# Patient Record
Sex: Female | Born: 1935 | Race: White | Hispanic: No | Marital: Single | Smoking: Never smoker
Health system: Southern US, Community
[De-identification: ages and names within clinical notes are randomized; demographics above are authoritative.]

## PROBLEM LIST (undated history)

## (undated) DIAGNOSIS — I10 Essential (primary) hypertension: Secondary | ICD-10-CM

## (undated) DIAGNOSIS — R112 Nausea with vomiting, unspecified: Secondary | ICD-10-CM

## (undated) DIAGNOSIS — E119 Type 2 diabetes mellitus without complications: Secondary | ICD-10-CM

## (undated) DIAGNOSIS — E7800 Pure hypercholesterolemia, unspecified: Secondary | ICD-10-CM

## (undated) DIAGNOSIS — Z9889 Other specified postprocedural states: Secondary | ICD-10-CM

## (undated) DIAGNOSIS — K219 Gastro-esophageal reflux disease without esophagitis: Secondary | ICD-10-CM

## (undated) HISTORY — PX: THYROIDECTOMY, PARTIAL: SHX18

## (undated) HISTORY — PX: TONSILLECTOMY: SUR1361

## (undated) HISTORY — PX: ABDOMINAL HYSTERECTOMY: SHX81

## (undated) HISTORY — PX: CORONARY STENT PLACEMENT: SHX1402

---

## 2013-01-27 ENCOUNTER — Emergency Department: Payer: Self-pay | Admitting: Emergency Medicine

## 2013-01-27 LAB — CBC
HGB: 15.2 g/dL (ref 12.0–16.0)
MCH: 29.9 pg (ref 26.0–34.0)
Platelet: 176 10*3/uL (ref 150–440)
RBC: 5.07 10*6/uL (ref 3.80–5.20)
RDW: 13.8 % (ref 11.5–14.5)

## 2013-01-27 LAB — BASIC METABOLIC PANEL
Anion Gap: 4 — ABNORMAL LOW (ref 7–16)
Calcium, Total: 10.2 mg/dL — ABNORMAL HIGH (ref 8.5–10.1)
EGFR (African American): >60
EGFR (Non-African Amer.): >60
Osmolality: 288 (ref 275–301)
Sodium: 138 mmol/L (ref 136–145)

## 2013-08-04 ENCOUNTER — Emergency Department: Payer: Self-pay | Admitting: Emergency Medicine

## 2013-08-04 IMAGING — CR LEFT RING FINGER 2+V
1 series · 1 of 1 positions shown · non-contrast
Comparison: None.

CLINICAL DATA: Fall with left finger pain.

EXAM:
LEFT RING FINGER 2+V

[pa]
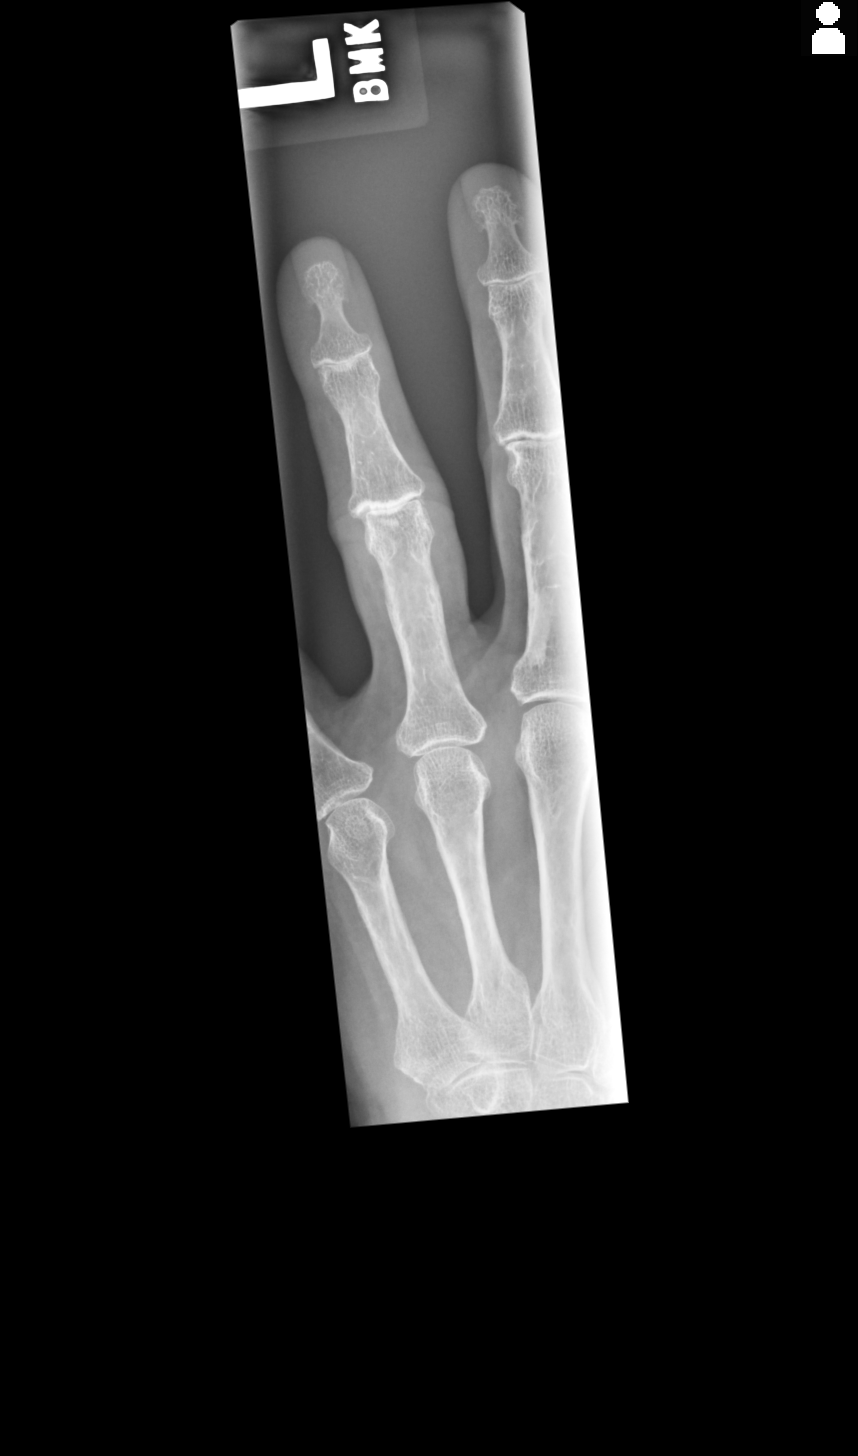

[1 of 1 positions shown; findings below may reference images not displayed]

FINDINGS: There is a tiny osseous fragment along the ventral base of the
middle phalanx. No additional evidence an acute fracture.
IMPRESSION: Tiny osseous fragment along the ventral base of the middle phalanx
is suspicious for a nondisplaced fracture.

## 2013-08-04 IMAGING — CR LEFT RING FINGER 2+V
1 series · 2 of 2 positions shown · non-contrast
Comparison: None.

CLINICAL DATA: Fall with left finger pain.

EXAM:
LEFT RING FINGER 2+V

[Series 1: oblique · 0.17mm/px · 2 of 2 slices shown]
[im 1/2]
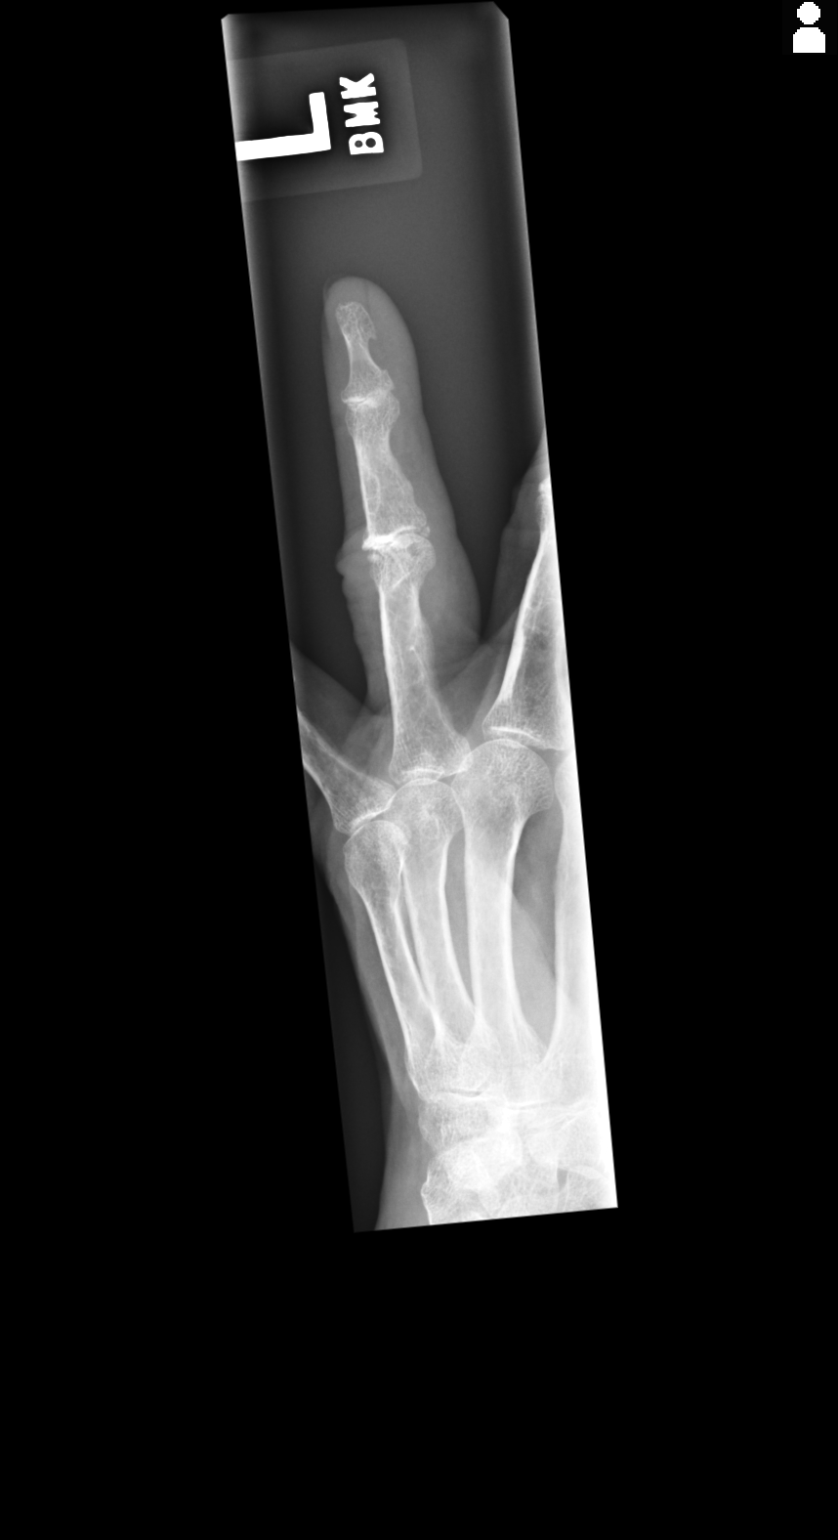
[im 2/2]
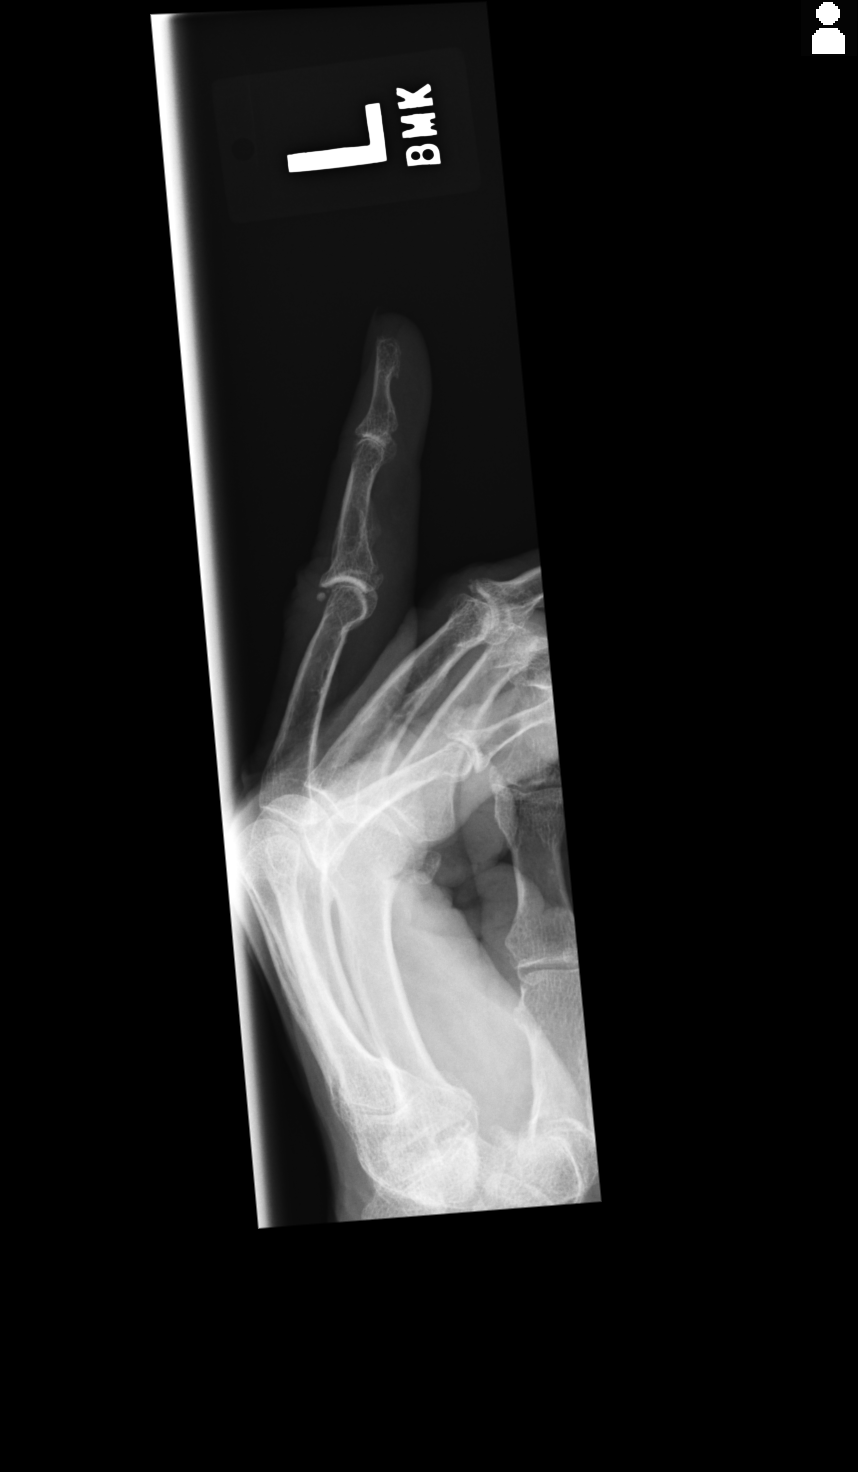

[2 of 2 positions shown; findings below may reference images not displayed]

FINDINGS: There is a tiny osseous fragment along the ventral base of the
middle phalanx. No additional evidence an acute fracture.
IMPRESSION: Tiny osseous fragment along the ventral base of the middle phalanx
is suspicious for a nondisplaced fracture.

## 2013-08-04 IMAGING — CT CT HEAD WITHOUT CONTRAST
1 series · 16 of 30 positions shown, 20 images · non-contrast
Comparison: No priors.

CLINICAL DATA: History of trauma from a fall with injury to the
forehead. Dizziness.

EXAM:
CT HEAD WITHOUT CONTRAST
TECHNIQUE: Contiguous axial images were obtained from the base of the skull
through the vertex without intravenous contrast.

[Series 3: head wo · axial · 0.43mm/px · z∈[+1209,+1338]mm · 16 of 32 slices shown, 20 images]
[im 2/32  brain]
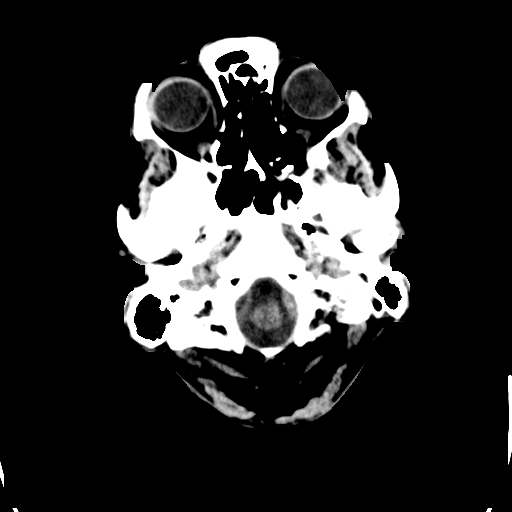
[im 2/32  bone]
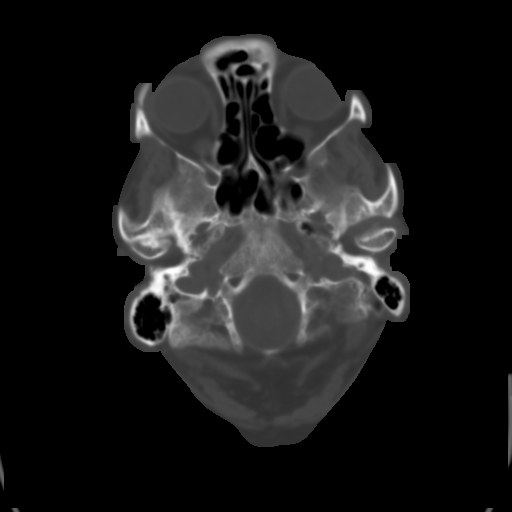
[im 4/32  brain]
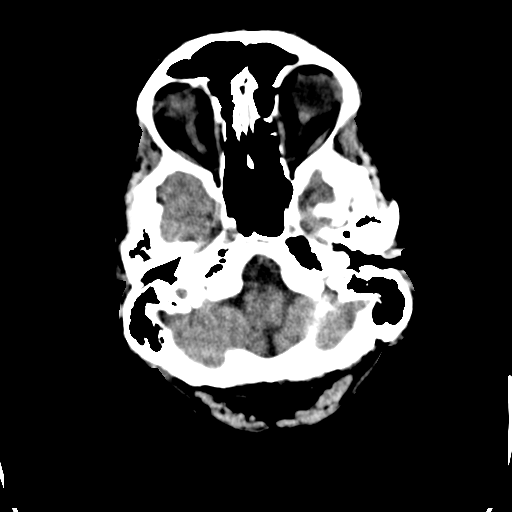
[im 6/32  brain]
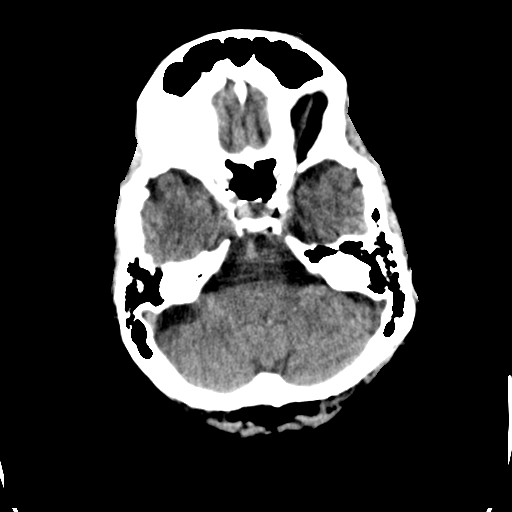
[im 8/32  brain]
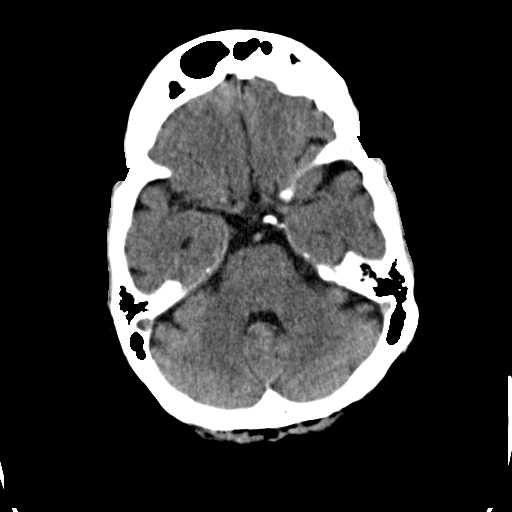
[im 9/32  brain]
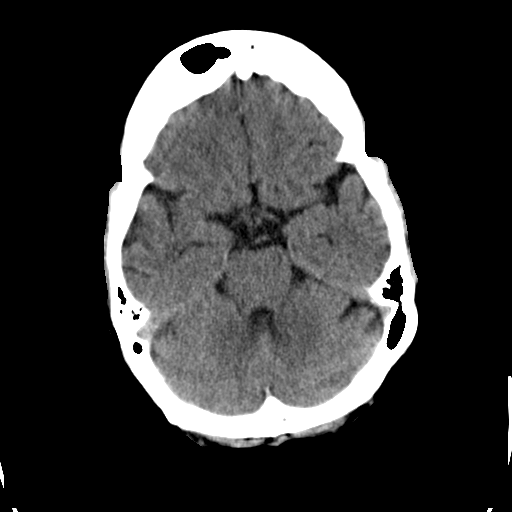
[im 9/32  bone]
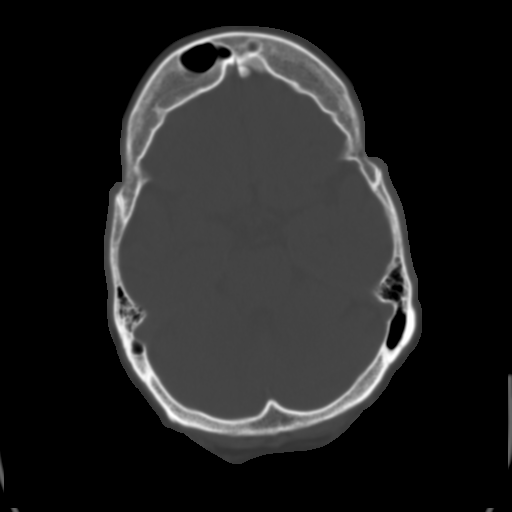
[im 11/32  brain]
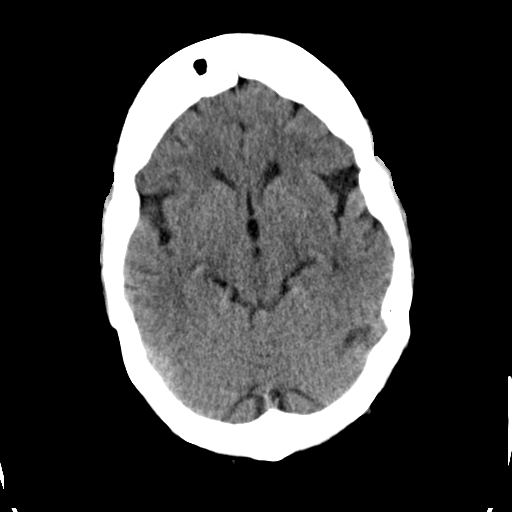
[im 13/32  brain]
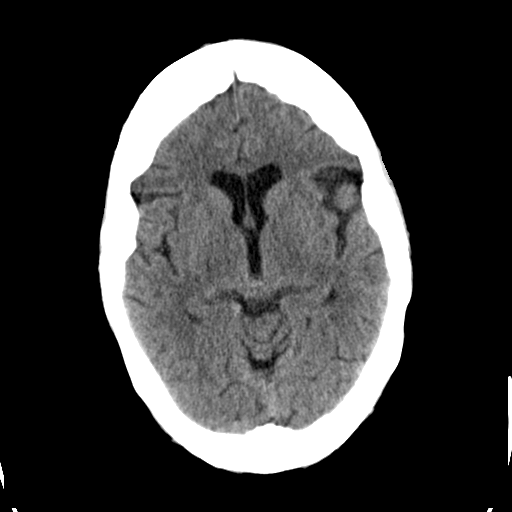
[im 15/32  brain]
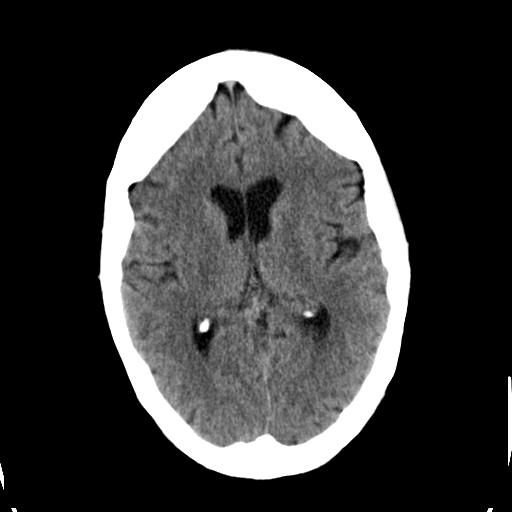
[im 17/32  brain]
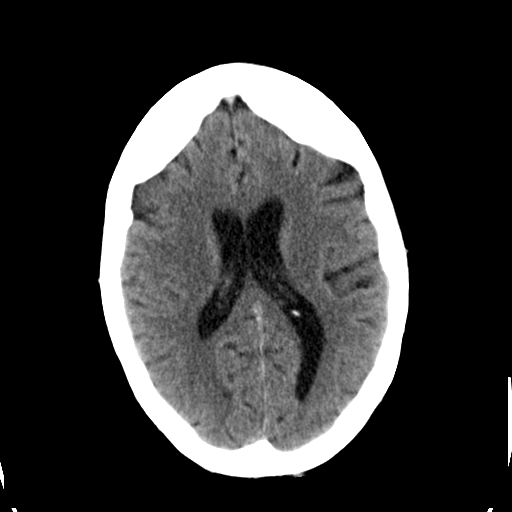
[im 17/32  bone]
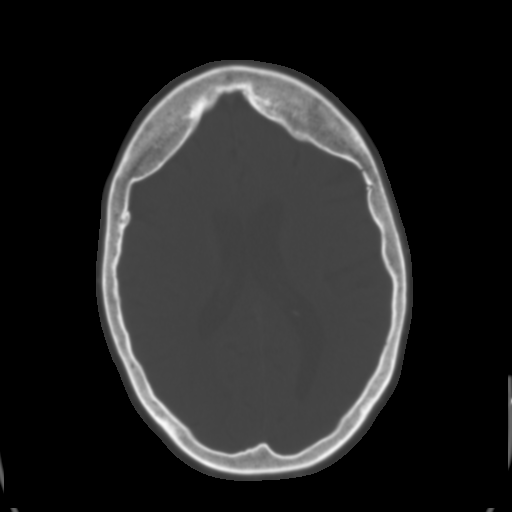
[im 19/32  brain]
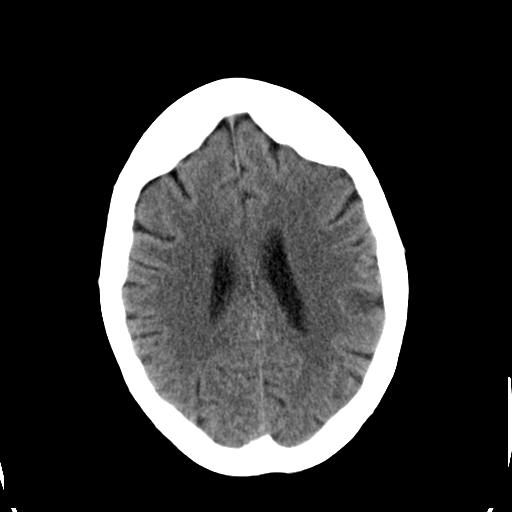
[im 21/32  brain]
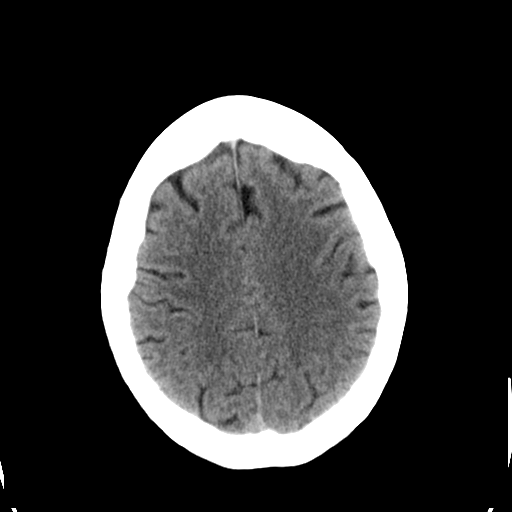
[im 23/32  brain]
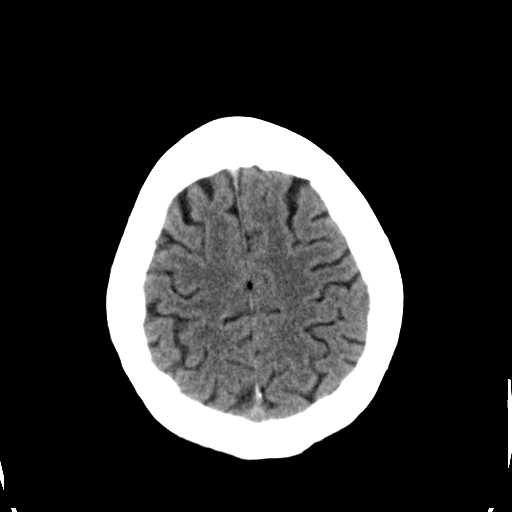
[im 24/32  brain]
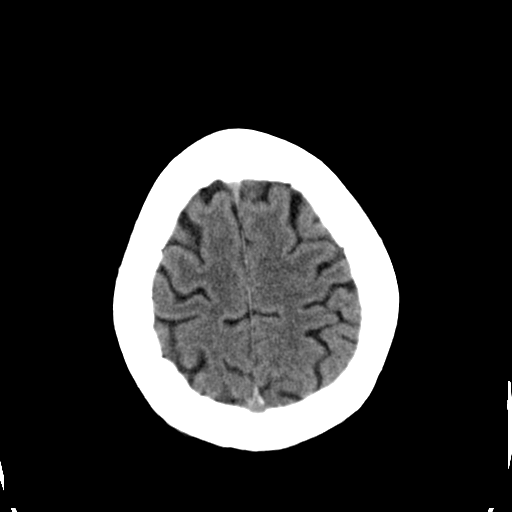
[im 24/32  bone]
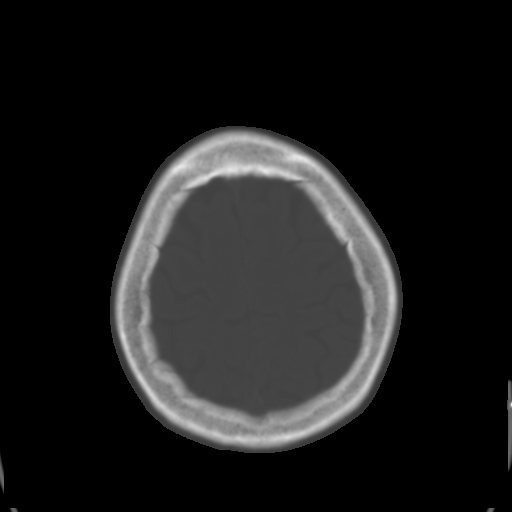
[im 26/32  brain]
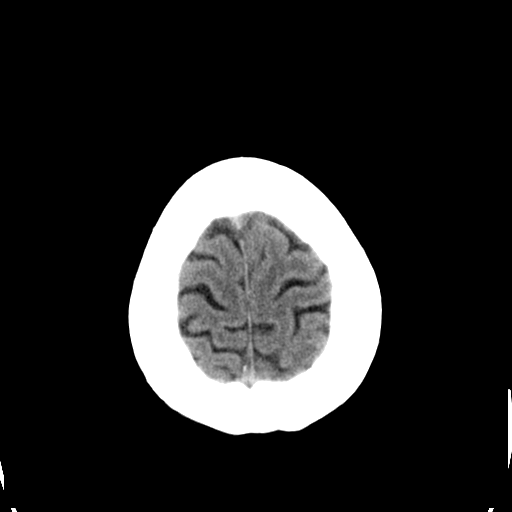
[im 28/32  brain]
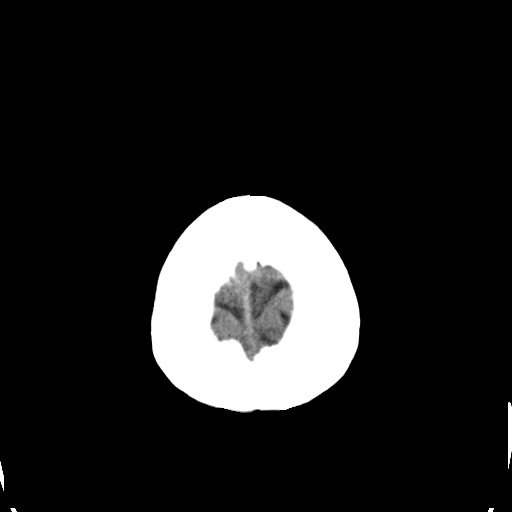
[im 30/32  brain]
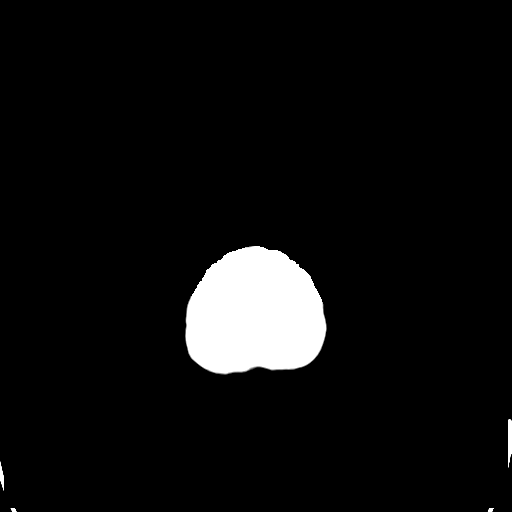

[16 of 30 positions shown; findings below may reference images not displayed]

FINDINGS: No acute displaced skull fractures are identified. No acute
intracranial abnormality. Specifically, no evidence of acute
post-traumatic intracranial hemorrhage, no definite regions of
acute/subacute cerebral ischemia, no focal mass, mass effect,
hydrocephalus or abnormal intra or extra-axial fluid collections.
The visualized paranasal sinuses and mastoids are well pneumatized.
IMPRESSION: 1. No acute displaced skull fractures or acute intracranial
abnormalities.
2. The appearance of the brain is normal.

## 2015-06-09 ENCOUNTER — Other Ambulatory Visit: Payer: Self-pay

## 2015-06-09 ENCOUNTER — Emergency Department: Payer: Medicare Other

## 2015-06-09 ENCOUNTER — Encounter: Payer: Self-pay | Admitting: *Deleted

## 2015-06-09 ENCOUNTER — Inpatient Hospital Stay
Admission: EM | Admit: 2015-06-09 | Discharge: 2015-06-11 | DRG: 378 | Disposition: A | Discharge status: Home / Self Care | Payer: Medicare Other | Attending: Internal Medicine | Admitting: Internal Medicine

## 2015-06-09 DIAGNOSIS — K921 Melena: Principal | ICD-10-CM | POA: Diagnosis present

## 2015-06-09 DIAGNOSIS — Z7982 Long term (current) use of aspirin: Secondary | ICD-10-CM

## 2015-06-09 DIAGNOSIS — K449 Diaphragmatic hernia without obstruction or gangrene: Secondary | ICD-10-CM | POA: Diagnosis present

## 2015-06-09 DIAGNOSIS — K2900 Acute gastritis without bleeding: Secondary | ICD-10-CM | POA: Diagnosis present

## 2015-06-09 DIAGNOSIS — E119 Type 2 diabetes mellitus without complications: Secondary | ICD-10-CM | POA: Diagnosis present

## 2015-06-09 DIAGNOSIS — R002 Palpitations: Secondary | ICD-10-CM

## 2015-06-09 DIAGNOSIS — I10 Essential (primary) hypertension: Secondary | ICD-10-CM | POA: Diagnosis present

## 2015-06-09 DIAGNOSIS — E785 Hyperlipidemia, unspecified: Secondary | ICD-10-CM | POA: Diagnosis present

## 2015-06-09 DIAGNOSIS — D62 Acute posthemorrhagic anemia: Secondary | ICD-10-CM | POA: Diagnosis present

## 2015-06-09 DIAGNOSIS — Z8249 Family history of ischemic heart disease and other diseases of the circulatory system: Secondary | ICD-10-CM | POA: Diagnosis not present

## 2015-06-09 DIAGNOSIS — K922 Gastrointestinal hemorrhage, unspecified: Secondary | ICD-10-CM | POA: Diagnosis present

## 2015-06-09 DIAGNOSIS — E7800 Pure hypercholesterolemia, unspecified: Secondary | ICD-10-CM | POA: Diagnosis present

## 2015-06-09 DIAGNOSIS — T39395A Adverse effect of other nonsteroidal anti-inflammatory drugs [NSAID], initial encounter: Secondary | ICD-10-CM | POA: Diagnosis present

## 2015-06-09 DIAGNOSIS — D649 Anemia, unspecified: Secondary | ICD-10-CM

## 2015-06-09 DIAGNOSIS — Z7984 Long term (current) use of oral hypoglycemic drugs: Secondary | ICD-10-CM

## 2015-06-09 DIAGNOSIS — K319 Disease of stomach and duodenum, unspecified: Secondary | ICD-10-CM | POA: Diagnosis present

## 2015-06-09 DIAGNOSIS — Z79899 Other long term (current) drug therapy: Secondary | ICD-10-CM

## 2015-06-09 HISTORY — DX: Type 2 diabetes mellitus without complications: E11.9

## 2015-06-09 HISTORY — DX: Pure hypercholesterolemia, unspecified: E78.00

## 2015-06-09 HISTORY — DX: Essential (primary) hypertension: I10

## 2015-06-09 LAB — CBC
HEMATOCRIT: 22.4 % — AB (ref 35.0–47.0)
HEMOGLOBIN: 7.5 g/dL — AB (ref 12.0–16.0)
MCH: 29.3 pg (ref 26.0–34.0)
MCHC: 33.3 g/dL (ref 32.0–36.0)
MCV: 87.9 fL (ref 80.0–100.0)
Platelets: 232 10*3/uL (ref 150–440)
RBC: 2.55 MIL/uL — ABNORMAL LOW (ref 3.80–5.20)
RDW: 15.5 % — ABNORMAL HIGH (ref 11.5–14.5)
WBC: 6.9 10*3/uL (ref 3.6–11.0)

## 2015-06-09 LAB — ABO/RH: ABO/RH(D): O NEG

## 2015-06-09 LAB — PREPARE RBC (CROSSMATCH)

## 2015-06-09 LAB — BASIC METABOLIC PANEL
Anion gap: 7 (ref 5–15)
BUN: 17 mg/dL (ref 6–20)
CO2: 25 mmol/L (ref 22–32)
Calcium: 10.1 mg/dL (ref 8.9–10.3)
Chloride: 104 mmol/L (ref 101–111)
Creatinine, Ser: 0.65 mg/dL (ref 0.44–1.00)
GFR calc Af Amer: >60 mL/min (ref >60)
Glucose, Bld: 146 mg/dL — ABNORMAL HIGH (ref 65–99)
POTASSIUM: 3.8 mmol/L (ref 3.5–5.1)
SODIUM: 136 mmol/L (ref 135–145)

## 2015-06-09 LAB — TROPONIN I: Troponin I: <0.03 ng/mL (ref <0.031)

## 2015-06-09 LAB — GLUCOSE, CAPILLARY: Glucose-Capillary: 144 mg/dL — ABNORMAL HIGH (ref 65–99)

## 2015-06-09 IMAGING — CR DG CHEST 2V
1 series · 2 of 2 positions shown · non-contrast
Comparison: None.

CLINICAL DATA: Tachycardia and shortness of breath

EXAM:
CHEST  2 VIEW

[Series 1: dg chest 2 view · 0.14mm/px · 2 of 2 slices shown]
[im 1/2]
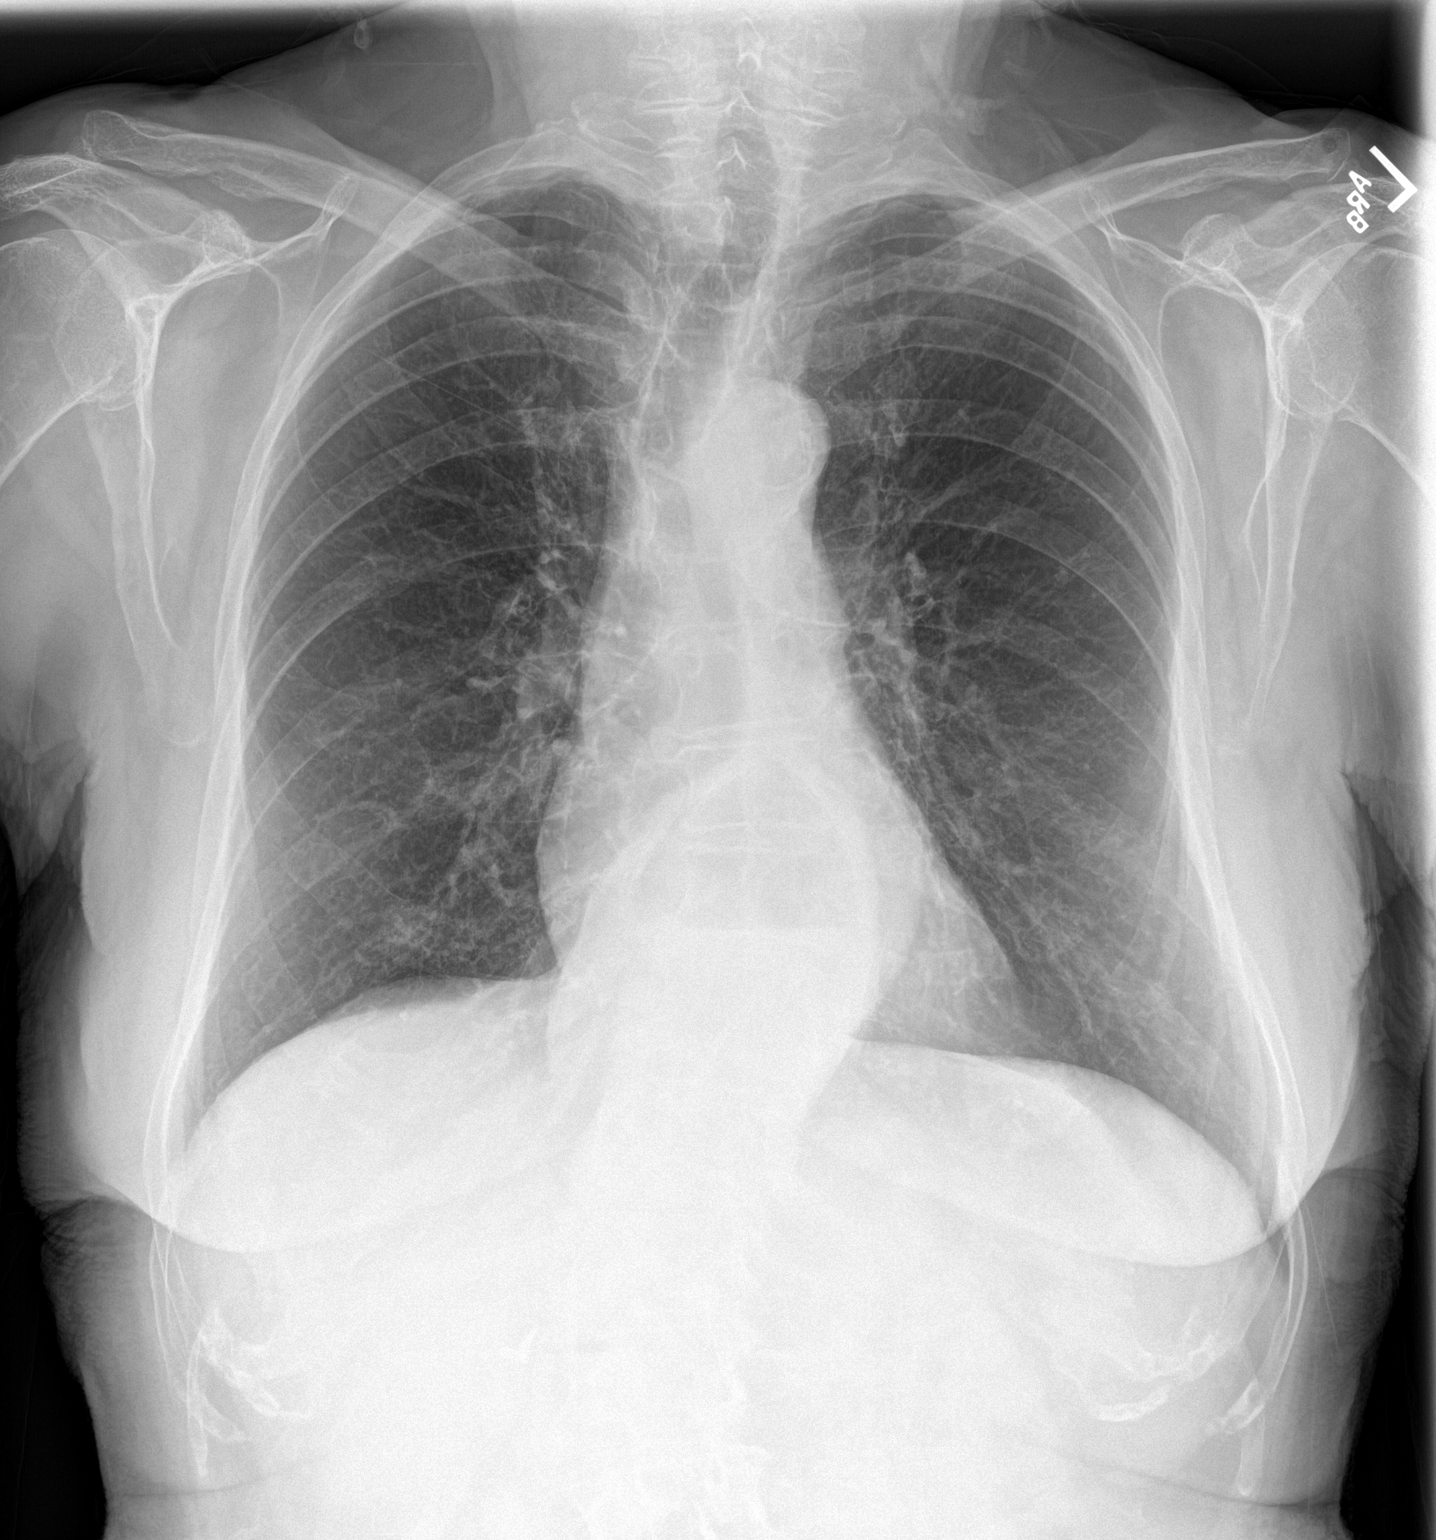
[im 2/2]
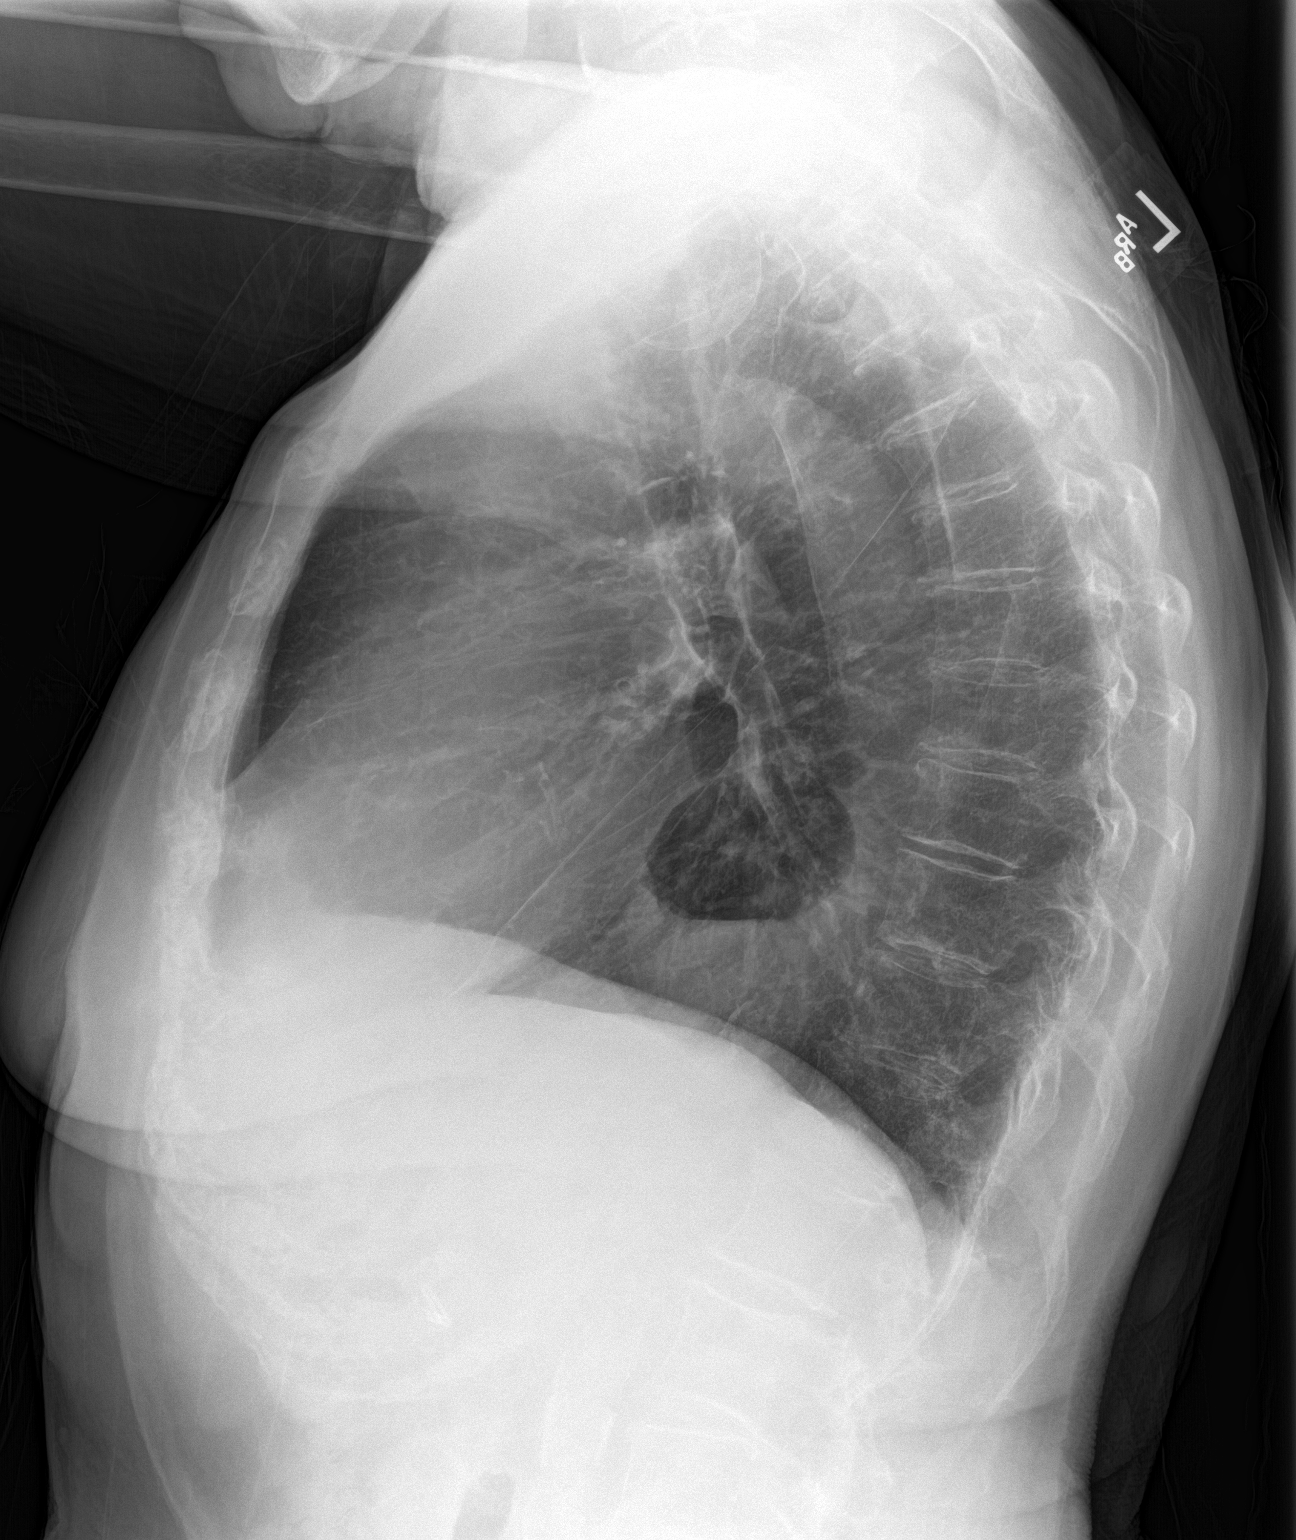

[2 of 2 positions shown; findings below may reference images not displayed]

FINDINGS: Cardiac shadow is within normal limits. A large hiatal hernia is
seen. The lungs are well aerated bilaterally without focal
infiltrate. No acute bony abnormality is noted.
IMPRESSION: No acute abnormality seen.

## 2015-06-09 MED ORDER — ONDANSETRON HCL 4 MG/2ML IJ SOLN: 4.0000 mg | Freq: Four times a day (QID) | INTRAMUSCULAR | Status: DC | PRN

## 2015-06-09 MED ORDER — ENALAPRIL MALEATE 10 MG PO TABS
10.0000 mg | ORAL_TABLET | Freq: Every day | ORAL | Status: DC
  Administered 2015-06-10 – 2015-06-11 (×2): 10 mg via ORAL
  Filled 2015-06-09 (×3): qty 1

## 2015-06-09 MED ORDER — MORPHINE SULFATE (PF) 2 MG/ML IV SOLN: 2.0000 mg | INTRAVENOUS | Status: DC | PRN

## 2015-06-09 MED ORDER — INSULIN ASPART 100 UNIT/ML ~~LOC~~ SOLN: 0.0000 [IU] | Freq: Every day | SUBCUTANEOUS | Status: DC

## 2015-06-09 MED ORDER — ONDANSETRON HCL 4 MG/2ML IJ SOLN
4.0000 mg | Freq: Once | INTRAMUSCULAR | Status: AC
  Administered 2015-06-09: 4 mg via INTRAVENOUS
  Filled 2015-06-09: qty 2

## 2015-06-09 MED ORDER — ATORVASTATIN CALCIUM 20 MG PO TABS
40.0000 mg | ORAL_TABLET | Freq: Every day | ORAL | Status: DC
  Administered 2015-06-10: 40 mg via ORAL
  Filled 2015-06-09: qty 2

## 2015-06-09 MED ORDER — SODIUM CHLORIDE 0.9 % IV BOLUS (SEPSIS)
1000.0000 mL | Freq: Once | INTRAVENOUS | Status: AC
  Administered 2015-06-09: 1000 mL via INTRAVENOUS

## 2015-06-09 MED ORDER — PANTOPRAZOLE SODIUM 40 MG IV SOLR
40.0000 mg | Freq: Once | INTRAVENOUS | Status: AC
  Administered 2015-06-09: 40 mg via INTRAVENOUS
  Filled 2015-06-09: qty 40

## 2015-06-09 MED ORDER — METOPROLOL SUCCINATE ER 50 MG PO TB24
50.0000 mg | ORAL_TABLET | Freq: Every day | ORAL | Status: DC
  Administered 2015-06-09 – 2015-06-11 (×3): 50 mg via ORAL
  Filled 2015-06-09 (×3): qty 1

## 2015-06-09 MED ORDER — PANTOPRAZOLE SODIUM 40 MG IV SOLR: 40.0000 mg | Freq: Two times a day (BID) | INTRAVENOUS | Status: DC

## 2015-06-09 MED ORDER — SODIUM CHLORIDE 0.9 % IV SOLN
10.0000 mL/h | Freq: Once | INTRAVENOUS | Status: AC
  Administered 2015-06-09: 10 mL/h via INTRAVENOUS

## 2015-06-09 MED ORDER — ACETAMINOPHEN 325 MG PO TABS: 650.0000 mg | ORAL_TABLET | Freq: Four times a day (QID) | ORAL | Status: DC | PRN

## 2015-06-09 MED ORDER — ACETAMINOPHEN 650 MG RE SUPP: 650.0000 mg | Freq: Four times a day (QID) | RECTAL | Status: DC | PRN

## 2015-06-09 MED ORDER — ATORVASTATIN CALCIUM 20 MG PO TABS: 40.0000 mg | ORAL_TABLET | Freq: Every day | ORAL | Status: DC

## 2015-06-09 MED ORDER — ONDANSETRON HCL 4 MG PO TABS
4.0000 mg | ORAL_TABLET | Freq: Four times a day (QID) | ORAL | Status: DC | PRN
Start: 2015-06-09 — End: 2015-06-11

## 2015-06-09 MED ORDER — INSULIN ASPART 100 UNIT/ML ~~LOC~~ SOLN: 0.0000 [IU] | Freq: Three times a day (TID) | SUBCUTANEOUS | Status: DC

## 2015-06-09 MED ORDER — PANTOPRAZOLE SODIUM 40 MG IV SOLR
40.0000 mg | Freq: Two times a day (BID) | INTRAVENOUS | Status: DC
  Administered 2015-06-09 – 2015-06-11 (×3): 40 mg via INTRAVENOUS
  Filled 2015-06-09 (×3): qty 40

## 2015-06-09 MED ORDER — OXYCODONE HCL 5 MG PO TABS: 5.0000 mg | ORAL_TABLET | ORAL | Status: DC | PRN

## 2015-06-09 NOTE — H&P (Signed)
[facility] [place] at [facility]   PATIENT NAME: Roberta Leon    MR#:  [id number]  DATE OF BIRTH:  December 18, 1928   DATE OF ADMISSION:  06/09/2015  PRIMARY CARE PHYSICIAN: No primary care provider on file.   REQUESTING/REFERRING PHYSICIAN: Scotty CourtStafford  CHIEF COMPLAINT:   Chief Complaint  Patient presents with  . Palpitations   weakness  HISTORY OF PRESENT ILLNESS:  Roberta Leon  is a 82 y.o. female with a known history of type 2 diabetes non-insulin-requiring, essential hypertension who is presenting with palpitations and fatigue. She describes progressive symptoms over approximately 1 week duration however worsened for the last day this includes palpitations which she feels most prominently upper chest and shoulder region. She describes having melena for one week total duration which she has become increasingly fatigued and weak for the time period but denies any frank chest pain or shortness of breath. On arrival to the emergency department basic labs reveal hemoglobin 7.5 it was 13 approximately 1 month ago.  PAST MEDICAL HISTORY:   Past Medical History  Diagnosis Date  . Diabetes mellitus without complication (HCC)   . Hypertension   . High cholesterol     PAST SURGICAL HISTORY:   Past Surgical History  Procedure Laterality Date  . Coronary stent placement      SOCIAL HISTORY:   Social History  Substance Use Topics  . Smoking status: Unknown If Ever Smoked  . Smokeless tobacco: Not on file  . Alcohol Use: Not on file    FAMILY HISTORY:   Family History  Problem Relation Age of Onset  . Hypertension Other     DRUG ALLERGIES:  Allergies not on file  REVIEW OF SYSTEMS:  REVIEW OF SYSTEMS:  CONSTITUTIONAL: Denies fevers, chills, positive fatigue, weakness.  EYES: Denies blurred vision, double vision, or eye pain.  EARS, NOSE, THROAT: Denies tinnitus, ear pain, hearing loss.  RESPIRATORY: denies cough, shortness of breath,  wheezing  CARDIOVASCULAR: Denies chest pain, positive palpitations, denies edema.  GASTROINTESTINAL: Denies nausea, vomiting, diarrhea, abdominal pain. Positive melena GENITOURINARY: Denies dysuria, hematuria.  ENDOCRINE: Denies nocturia or thyroid problems. HEMATOLOGIC AND LYMPHATIC: Denies easy bruising or bleeding.  SKIN: Denies rash or lesions.  MUSCULOSKELETAL: Denies pain in neck, back, shoulder, knees, hips, or further arthritic symptoms.  NEUROLOGIC: Denies paralysis, paresthesias.  PSYCHIATRIC: Denies anxiety or depressive symptoms. Otherwise full review of systems performed by me is negative.   MEDICATIONS AT HOME:   Prior to Admission medications   Medication Sig Start Date End Date Taking? Authorizing Provider  aspirin EC 81 MG tablet Take 81 mg by mouth daily.   Yes Historical Provider, MD  enalapril (VASOTEC) 10 MG tablet Take 10 mg by mouth daily.   Yes Historical Provider, MD  metFORMIN (GLUCOPHAGE) 500 MG tablet Take 500 mg by mouth 2 (two) times daily with a meal.   Yes Historical Provider, MD  metoprolol succinate (TOPROL-XL) 50 MG 24 hr tablet Take 50 mg by mouth daily. Take with or immediately following a meal.   Yes Historical Provider, MD  simvastatin (ZOCOR) 80 MG tablet Take 80 mg by mouth daily.   Yes Historical Provider, MD      VITAL SIGNS:  Blood pressure 156/83, pulse 84, temperature 98.5 F (36.9 C), temperature source Oral, resp. rate 18, height 5\' 3"  (1.6 m), weight 60.782 kg (134 lb), SpO2 99 %.  PHYSICAL EXAMINATION:  VITAL SIGNS: Filed Vitals:   06/09/15 1403 06/09/15 1607  BP: 170/70  156/83  Pulse: 98 84  Temp: 98.5 F (36.9 C)   Resp: 18 18   GENERAL:82 y.o.female currently in no acute distress.  HEAD: Normocephalic, atraumatic.  EYES: Pupils equal, round, reactive to light. Extraocular muscles intact. No scleral icterus.  MOUTH: Moist mucosal membrane. Dentition intact. No abscess noted.  EAR, NOSE, THROAT: Clear without exudates. No  external lesions.  NECK: Supple. No thyromegaly. No nodules. No JVD.  PULMONARY: Clear to ascultation, without wheeze rails or rhonci. No use of accessory muscles, Good respiratory effort. good air entry bilaterally CHEST: Nontender to palpation.  CARDIOVASCULAR: S1 and S2. Regular rate and rhythm. No murmurs, rubs, or gallops. No edema. Pedal pulses 2+ bilaterally.  GASTROINTESTINAL: Soft, nontender, nondistended. No masses. Positive bowel sounds. No hepatosplenomegaly.  MUSCULOSKELETAL: No swelling, clubbing, or edema. Range of motion full in all extremities.  NEUROLOGIC: Cranial nerves II through XII are intact. No gross focal neurological deficits. Sensation intact. Reflexes intact.  SKIN: No ulceration, lesions, rashes, or cyanosis. Skin warm and dry. Turgor intact.  PSYCHIATRIC: Mood, affect within normal limits. The patient is awake, alert and oriented x 3. Insight, judgment intact.    LABORATORY PANEL:   CBC  Recent Labs Lab 06/09/15 1405  WBC 6.9  HGB 7.5*  HCT 22.4*  PLT 232   ------------------------------------------------------------------------------------------------------------------  Chemistries   Recent Labs Lab 06/09/15 1405  NA 136  K 3.8  CL 104  CO2 25  GLUCOSE 146*  BUN 17  CREATININE 0.65  CALCIUM 10.1   ------------------------------------------------------------------------------------------------------------------  Cardiac Enzymes  Recent Labs Lab 06/09/15 1405  TROPONINI <0.03   ------------------------------------------------------------------------------------------------------------------  RADIOLOGY:  Dg Chest 2 View  06/09/2015  CLINICAL DATA:  Tachycardia and shortness of breath EXAM: CHEST  2 VIEW COMPARISON:  None. FINDINGS: Cardiac shadow is within normal limits. A large hiatal hernia is seen. The lungs are well aerated bilaterally without focal infiltrate. No acute bony abnormality is noted. IMPRESSION: No acute abnormality  seen. Electronically Signed   By: Alcide Clever M.D.   On: 06/09/2015 14:53    EKG:   Orders placed or performed during the hospital encounter of 06/09/15  . ED EKG within 10 minutes  . ED EKG within 10 minutes    IMPRESSION AND PLAN:   82 year old Caucasian female history of type 2 diabetes non-insulin-requiring who is presenting with palpitations and fatigue.  1. Symptomatic anemia secondary to upper GI bleed: Transfuse 2 units packed red blood cell and emergency department orders are entered blood transfusion pending, Protonix drip, trend CBC, gastroenterology consult for potential endoscopy. Patient denies normal risk factors include NSAID use, tobacco use, alcohol use 2. Type 2 diabetes non-insulin-requiring hold oral agents at insulin sliding scale 3. Essential hypertension: Enalapril 4. Hyperlipidemia unspecified statin therapy 5. Venous thromboembolism prophylactic: SCDs given active bleed    All the records are reviewed and case discussed with ED provider. Management plans discussed with the patient, family and they are in agreement.  CODE STATUS: Full  TOTAL TIME TAKING CARE OF THIS PATIENT: 33 minutes.    Hower,  Mardi Mainland.D on 06/09/2015 at 4:17 PM  Between 7am to 6pm - Pager - [phone]  After 6pm: House Pager: - [phone]  [facility]  Office  [phone]  CC: Primary care physician; No primary care provider on file.

## 2015-06-09 NOTE — ED Notes (Signed)
Denies CP at this time

## 2015-06-09 NOTE — Consult Note (Signed)
GI Inpatient Consult Note  Reason for Consult:melena and anemia   Attending Requesting Consult:Dr. Hower  History of Present Illness: Roberta Leon is a 82 y.o. female with a history of melena for 5-7 days and a large drop in hgb from 11 to 7.5 who was admitted for GI bleeding likely PUD due to chronic use of Aleve 2 every night for bulging disk.  Colonoscopy at age 36, no repeat recommended.  Past Medical History:  Past Medical History  Diagnosis Date  . Diabetes mellitus without complication (HCC)   . Hypertension   . High cholesterol     Problem List: Patient Active Problem List   Diagnosis Date Noted  . Upper GI bleed 06/09/2015    Past Surgical History: Past Surgical History  Procedure Laterality Date  . Coronary stent placement      Allergies: Allergies  Allergen Reactions  . Niacin And Related Hives    tingling   . Penicillins Rash    Home Medications: Prescriptions prior to admission  Medication Sig Dispense Refill Last Dose  . aspirin EC 81 MG tablet Take 81 mg by mouth daily.   06/09/2015 at 0800  . cholecalciferol (VITAMIN D) 1000 units tablet Take 1,000 Units by mouth daily.   06/09/2015 at 0800  . enalapril (VASOTEC) 10 MG tablet Take 10 mg by mouth daily.   06/09/2015 at 0800  . hydrochlorothiazide (MICROZIDE) 12.5 MG capsule Take 12.5 mg by mouth daily.   06/09/2015 at 0800  . metFORMIN (GLUCOPHAGE) 500 MG tablet Take 500 mg by mouth 2 (two) times daily with a meal.   06/09/2015 at 0800  . metoprolol succinate (TOPROL-XL) 50 MG 24 hr tablet Take 50 mg by mouth daily. Take with or immediately following a meal.   06/09/2015 at 0800  . simvastatin (ZOCOR) 80 MG tablet Take 80 mg by mouth daily.   06/09/2015 at 0800  . vitamin B-12 (CYANOCOBALAMIN) 1000 MCG tablet Take 1,000 mcg by mouth daily.   06/09/2015 at 0800   Home medication reconciliation was completed with the patient.   Scheduled Inpatient Medications:   . [START ON 06/10/2015] atorvastatin  40 mg Oral  q1800  . enalapril  10 mg Oral Daily  . insulin aspart  0-5 Units Subcutaneous QHS  . [START ON 06/10/2015] insulin aspart  0-9 Units Subcutaneous TID WC  . metoprolol succinate  50 mg Oral Daily  . pantoprazole (PROTONIX) IV  40 mg Intravenous Q12H    Continuous Inpatient Infusions:     PRN Inpatient Medications:  acetaminophen **OR** acetaminophen, morphine injection, ondansetron **OR** ondansetron (ZOFRAN) IV, oxyCODONE  Family History: family history includes Hypertension in her other.  The patient's family history is negative for inflammatory bowel disorders, GI malignancy, or solid organ transplantation.  Social History:   reports that she has never smoked. She does not have any smokeless tobacco history on file. She reports that she does not drink alcohol or use illicit drugs. The patient denies ETOH, tobacco, or drug use.   Review of Systems: Constitutional: Weight is stable.  Eyes: No changes in vision. ENT: No oral lesions, sore throat.  GI: see HPI.  Heme/Lymph: No easy bruising.  CV: No chest pain.  GU: No hematuria.  Pul: no SOB Integumentary: No rashes.  Neuro: No headaches.  Psych: No depression/anxiety.  Endocrine: No heat/cold intolerance.  Allergic/Immunologic: No urticaria.  Resp: No cough, SOB.  Musculoskeletal: No joint swelling.    Physical Examination: BP 170/62 mmHg  Pulse 77  Temp(Src)  98.2 F (36.8 C) (Oral)  Resp 16  Ht 5\' 3"  (1.6 m)  Wt 60.782 kg (134 lb)  BMI 23.74 kg/m2  SpO2 98% Gen: NAD, alert and oriented x 4 HEENT: PEERLA, EOMI, conjunctiva pale Neck: supple, no JVD or thyromegaly Chest: CTA bilaterally, no wheezes, crackles, or other adventitious sounds CV: RRR, no m/g/c/r Abd: soft, NT, ND, +BS in all four quadrants; no HSM, guarding, ridigity, or rebound tenderness Ext: no edema, well perfused with 2+ pulses, Skin: no rash or lesions noted Lymph: no LAD  Data: Lab Results  Component Value Date   WBC 6.9 06/09/2015   HGB  7.5* 06/09/2015   HCT 22.4* 06/09/2015   MCV 87.9 06/09/2015   PLT 232 06/09/2015    Recent Labs Lab 06/09/15 1405  HGB 7.5*   Lab Results  Component Value Date   NA 136 06/09/2015   K 3.8 06/09/2015   CL 104 06/09/2015   CO2 25 06/09/2015   BUN 17 06/09/2015   CREATININE 0.65 06/09/2015   No results found for: ALT, AST, GGT, ALKPHOS, BILITOT No results for input(s): APTT, INR, PTT in the last 168 hours. Assessment/Plan: Roberta Leon is a 82 y.o. female with UGI bleed due to NSAID use.  Admit and give iv PPI, transfuse per Hospitalist plan, do EGD tomorrow after the repeat lab work is evaluated.  Recommendations:Transfuse, give iv PPI, clear liquids, NPO PMN for EGD tomorrow.  Thank you for the consult. Please call with questions or concerns.  Lynnae PrudeELLIOTT, ROBERT, MD

## 2015-06-09 NOTE — ED Notes (Signed)
States she feels like her heart has been racing for 2 days, states some SOB but no pain, states dark stools for several days, awake and alert, pt only on daily asa

## 2015-06-09 NOTE — ED Provider Notes (Signed)
[facility] Emergency Department Provider Note  ____________________________________________  Time seen: 3:35 PM  I have reviewed the triage vital signs and the nursing notes.   HISTORY  Chief Complaint Palpitations    HPI Roberta Leon is a 82 y.o. female who complains of palpitations and dizziness severe fatigue and some occasional shortness of breath for the past 2 days. Denies chest pain. All of the symptoms are worse when she stands up and especially when she tries to walk around. Denies syncope or any falls or injuries. She has been having black stools over the past 5 days accompanied with generalized abdominal pain. No nausea or vomiting. She denies a history of GI bleed. Takes only a very rare naproxen, approximate 4 doses in the past week. Has not been on any steroids or chemotherapeutics recently.     Past Medical History  Diagnosis Date  . Diabetes mellitus without complication (HCC)   . Hypertension   . High cholesterol   CAD   Patient Active Problem List   Diagnosis Date Noted  . Upper GI bleed 06/09/2015     Past Surgical History  Procedure Laterality Date  . Coronary stent placement       No current outpatient prescriptions on file. Metformin Enalapril Aspirin Metoprolol Simvastatin HCTZ  Allergies Review of patient's allergies indicates not on file.   Family History  Problem Relation Age of Onset  . Hypertension Other     Social History Social History  Substance Use Topics  . Smoking status: Unknown If Ever Smoked  . Smokeless tobacco: None  . Alcohol Use: None  No tobacco alcohol or drug use  Review of Systems  Constitutional:   No fever or chills. Positive fatigue Eyes:   No vision changes.  ENT:   No sore throat. No rhinorrhea. Cardiovascular:   No chest pain. Respiratory:   Occasional shortness of breath.. Gastrointestinal:   Negative for abdominal pain, vomiting and diarrhea.  No bloody  stool. Genitourinary:   Negative for dysuria or difficulty urinating. Musculoskeletal:   Negative for focal pain or swelling Neurological:   Negative for headaches. Positive dizziness 10-point ROS otherwise negative.  ____________________________________________   PHYSICAL EXAM:  VITAL SIGNS: ED Triage Vitals  Enc Vitals Group     BP 06/09/15 1403 170/70 mmHg     Pulse Rate 06/09/15 1403 98     Resp 06/09/15 1403 18     Temp 06/09/15 1403 98.5 F (36.9 C)     Temp Source 06/09/15 1403 Oral     SpO2 06/09/15 1403 100 %     Weight 06/09/15 1403 134 lb (60.782 kg)     Height 06/09/15 1403  (1.6 m)     Head Cir --      Peak Flow --      Pain Score --      Pain Loc --      Pain Edu? --      Excl. in GC? --     Vital signs reviewed, nursing assessments reviewed.   Constitutional:   Alert and oriented. Well appearing and in no distress. Eyes:   No scleral icterus. Positive conjunctival pallor. PERRL. EOMI ENT   Head:   Normocephalic and atraumatic.   Nose:   No congestion/rhinnorhea. No septal hematoma   Mouth/Throat:   Dry mucous membranes, no pharyngeal erythema. No peritonsillar mass.    Neck:   No stridor. No SubQ emphysema. No meningismus. Hematological/Lymphatic/Immunilogical:   No cervical lymphadenopathy. Cardiovascular:  RRR. Symmetric bilateral radial and DP pulses.  No murmurs.  Respiratory:   Normal respiratory effort without tachypnea nor retractions. Breath sounds are clear and equal bilaterally. No wheezes/rales/rhonchi. Gastrointestinal:   Soft with diffuse mild tenderness. Non distended. There is no CVA tenderness.  No rebound, rigidity, or guarding. Rectal exam reveals black stool which is strongly Hemoccult positive. There are also numerous external hemorrhoids. These are not thrombosed bleeding or inflamed.  Genitourinary:   deferred Musculoskeletal:   Nontender with normal range of motion in all extremities. No joint effusions.  No lower  extremity tenderness.  No edema. Neurologic:   Normal speech and language.  CN 2-10 normal. Motor grossly intact. No gross focal neurologic deficits are appreciated.  Skin:    Skin is warm, dry and intact. Diffusely pale. No rash noted.  No petechiae, purpura, or bullae.  ____________________________________________    LABS (pertinent positives/negatives) (all labs ordered are listed, but only abnormal results are displayed) Labs Reviewed  BASIC METABOLIC PANEL - Abnormal; Notable for the following:    Glucose, Bld 146 (*)    All other components within normal limits  CBC - Abnormal; Notable for the following:    RBC 2.55 (*)    Hemoglobin 7.5 (*)    HCT 22.4 (*)    RDW 15.5 (*)    All other components within normal limits  TROPONIN I  CBC  CBC  PREPARE RBC (CROSSMATCH)  TYPE AND SCREEN   ____________________________________________   EKG  Sinus rhythm rate of 92, normal axis and intervals. Normal QRS ST segments and T waves. There is one PVC on the strip.  ____________________________________________    RADIOLOGY  Chest x-ray unremarkable  ____________________________________________   PROCEDURES CRITICAL CARE Performed by: Scotty Court, Alzina Golda   Total critical care time: 35 minutes  Critical care time was exclusive of separately billable procedures and treating other patients.  Critical care was necessary to treat or prevent imminent or life-threatening deterioration.  Critical care was time spent personally by me on the following activities: development of treatment plan with patient and/or surrogate as well as nursing, discussions with consultants, evaluation of patient's response to treatment, examination of patient, obtaining history from patient or surrogate, ordering and performing treatments and interventions, ordering and review of laboratory studies, ordering and review of radiographic studies, pulse oximetry and re-evaluation of patient's  condition.   ____________________________________________   INITIAL IMPRESSION / ASSESSMENT AND PLAN / ED COURSE  Pertinent labs & imaging results that were available during my care of the patient were reviewed by me and considered in my medical decision making (see chart for details).  Patient presents with symptoms of dehydration with dizziness shortness breath and fatigue are worse with standing and exertion, likely orthostatic. Workup is significant for a hemoglobin of 7.5. Review of the electronic medical records shows that on 05/13/2015 her hemoglobin was 13 which is her normal baseline. This represents a 5 to 6. drop in her hemoglobin in the past month which is likely entirely attributable to the last 5 days when she's noticed melanotic stools. Treat the patient for acute upper GI bleeding with symptomatic anemia with IV fluids and Protonix. We'll go ahead and plan to transfuse her since she appears to be losing blood at a fairly rapid rate and I expect her hemoglobin to continue to trend downward fairly quickly. I explained the risks and benefits of transfusion of packed red blood cells to the patient and family and obtained verbal consent at the bedside during my  evaluation. Case was discussed with hospitalist for admission. We'll keep the patient nothing by mouth at this time until a potential plan with GI can be formulated by the admitting team.     ____________________________________________   FINAL CLINICAL IMPRESSION(S) / ED DIAGNOSES  Final diagnoses:  Acute upper GI bleed  Symptomatic anemia  Palpitations       Portions of this note were generated with dragon dictation software. Dictation errors may occur despite best attempts at proofreading.   Sharman CheekPhillip Theador Jezewski, MD 06/09/15 [phone]

## 2015-06-10 ENCOUNTER — Encounter: Payer: Self-pay | Admitting: *Deleted

## 2015-06-10 ENCOUNTER — Inpatient Hospital Stay: Payer: Medicare Other | Admitting: Anesthesiology

## 2015-06-10 ENCOUNTER — Encounter
Admission: EM | Disposition: A | Discharge status: Home / Self Care | Payer: Self-pay | Source: Home / Self Care | Attending: Internal Medicine

## 2015-06-10 HISTORY — PX: ESOPHAGOGASTRODUODENOSCOPY: SHX5428

## 2015-06-10 LAB — GLUCOSE, CAPILLARY
GLUCOSE-CAPILLARY: 93 mg/dL (ref 65–99)
GLUCOSE-CAPILLARY: 85 mg/dL (ref 65–99)
GLUCOSE-CAPILLARY: 84 mg/dL (ref 65–99)
Glucose-Capillary: 84 mg/dL (ref 65–99)

## 2015-06-10 LAB — BASIC METABOLIC PANEL
ANION GAP: 3 — AB (ref 5–15)
BUN: 13 mg/dL (ref 6–20)
CHLORIDE: 113 mmol/L — AB (ref 101–111)
CO2: 24 mmol/L (ref 22–32)
Calcium: 8.8 mg/dL — ABNORMAL LOW (ref 8.9–10.3)
Creatinine, Ser: 0.62 mg/dL (ref 0.44–1.00)
GFR calc Af Amer: >60 mL/min (ref >60)
GFR calc non Af Amer: >60 mL/min (ref >60)
GLUCOSE: 94 mg/dL (ref 65–99)
POTASSIUM: 3.5 mmol/L (ref 3.5–5.1)
Sodium: 140 mmol/L (ref 135–145)

## 2015-06-10 LAB — CBC
HCT: 25.7 % — ABNORMAL LOW (ref 35.0–47.0)
HEMATOCRIT: 27.1 % — AB (ref 35.0–47.0)
HEMATOCRIT: 25.2 % — AB (ref 35.0–47.0)
HEMATOCRIT: 25.4 % — AB (ref 35.0–47.0)
HEMOGLOBIN: 8.9 g/dL — AB (ref 12.0–16.0)
HEMOGLOBIN: 9.1 g/dL — AB (ref 12.0–16.0)
HEMOGLOBIN: 8.6 g/dL — AB (ref 12.0–16.0)
Hemoglobin: 8.6 g/dL — ABNORMAL LOW (ref 12.0–16.0)
MCH: 30.3 pg (ref 26.0–34.0)
MCH: 29.2 pg (ref 26.0–34.0)
MCH: 29.4 pg (ref 26.0–34.0)
MCH: 29.3 pg (ref 26.0–34.0)
MCHC: 34.8 g/dL (ref 32.0–36.0)
MCHC: 33.6 g/dL (ref 32.0–36.0)
MCHC: 34.2 g/dL (ref 32.0–36.0)
MCHC: 33.9 g/dL (ref 32.0–36.0)
MCV: 87 fL (ref 80.0–100.0)
MCV: 87 fL (ref 80.0–100.0)
MCV: 85.9 fL (ref 80.0–100.0)
MCV: 86.5 fL (ref 80.0–100.0)
PLATELETS: 154 10*3/uL (ref 150–440)
Platelets: 151 10*3/uL (ref 150–440)
Platelets: 156 10*3/uL (ref 150–440)
Platelets: 154 10*3/uL (ref 150–440)
RBC: 2.95 MIL/uL — AB (ref 3.80–5.20)
RBC: 3.11 MIL/uL — AB (ref 3.80–5.20)
RBC: 2.93 MIL/uL — AB (ref 3.80–5.20)
RBC: 2.94 MIL/uL — ABNORMAL LOW (ref 3.80–5.20)
RDW: 15.3 % — ABNORMAL HIGH (ref 11.5–14.5)
RDW: 15.5 % — ABNORMAL HIGH (ref 11.5–14.5)
RDW: 16.2 % — ABNORMAL HIGH (ref 11.5–14.5)
RDW: 15.4 % — AB (ref 11.5–14.5)
WBC: 4.4 10*3/uL (ref 3.6–11.0)
WBC: 4 10*3/uL (ref 3.6–11.0)
WBC: 4.5 10*3/uL (ref 3.6–11.0)
WBC: 4.9 10*3/uL (ref 3.6–11.0)

## 2015-06-10 LAB — HEMOGLOBIN A1C: HEMOGLOBIN A1C: UNDETERMINED % (ref 4.0–6.0)

## 2015-06-10 SURGERY — EGD (ESOPHAGOGASTRODUODENOSCOPY)
Anesthesia: General

## 2015-06-10 MED ORDER — PROPOFOL 10 MG/ML IV BOLUS
INTRAVENOUS | Status: DC | PRN
  Administered 2015-06-10: 50 mg via INTRAVENOUS

## 2015-06-10 MED ORDER — PROPOFOL 500 MG/50ML IV EMUL
INTRAVENOUS | Status: DC | PRN
  Administered 2015-06-10: 160 ug/kg/min via INTRAVENOUS

## 2015-06-10 MED ORDER — LIDOCAINE HCL (CARDIAC) 20 MG/ML IV SOLN
INTRAVENOUS | Status: DC | PRN
  Administered 2015-06-10: 30 mg via INTRAVENOUS

## 2015-06-10 MED ORDER — SODIUM CHLORIDE 0.9 % IV SOLN: INTRAVENOUS | Status: DC

## 2015-06-10 MED ORDER — VITAMIN B-12 1000 MCG PO TABS
1000.0000 ug | ORAL_TABLET | Freq: Every day | ORAL | Status: DC
  Administered 2015-06-10 – 2015-06-11 (×2): 1000 ug via ORAL
  Filled 2015-06-10 (×2): qty 1

## 2015-06-10 MED ORDER — MIDAZOLAM HCL 5 MG/5ML IJ SOLN
INTRAMUSCULAR | Status: DC | PRN
  Administered 2015-06-10: 1 mg via INTRAVENOUS

## 2015-06-10 MED ORDER — SODIUM CHLORIDE 0.9 % IV SOLN
INTRAVENOUS | Status: DC | PRN
  Administered 2015-06-10: 10:00:00 via INTRAVENOUS
  Administered 2015-06-10: 1000 mL

## 2015-06-10 MED ORDER — HYDROCHLOROTHIAZIDE 12.5 MG PO CAPS
12.5000 mg | ORAL_CAPSULE | Freq: Every day | ORAL | Status: DC
  Administered 2015-06-10 – 2015-06-11 (×2): 12.5 mg via ORAL
  Filled 2015-06-10 (×2): qty 1

## 2015-06-10 MED ORDER — VITAMIN D 1000 UNITS PO TABS
1000.0000 [IU] | ORAL_TABLET | Freq: Every day | ORAL | Status: DC
  Administered 2015-06-10 – 2015-06-11 (×2): 1000 [IU] via ORAL
  Filled 2015-06-10 (×2): qty 1

## 2015-06-10 NOTE — Op Note (Signed)
[facility] Patient Name: Roberta Leon Procedure Date: 06/10/2015 9:44 AM MRN: [id number] Account #: [id number] Date of Birth: 11/19/1928 Admit Type: Inpatient Age: 81 Room: [facility] ENDO ROOM 1 Gender: Female Note Status: Finalized Procedure:            Upper GI endoscopy Indications:          Acute post hemorrhagic anemia, Melena Providers:            Scot Jun, MD Referring MD:         Barbette Reichmann, MD (Referring MD) Medicines:            Propofol per Anesthesia Complications:        No immediate complications. Procedure:            Pre-Anesthesia Assessment:                       - After reviewing the risks and benefits, the patient                        was deemed in satisfactory condition to undergo the                        procedure.                       After obtaining informed consent, the endoscope was                        passed under direct vision. Throughout the procedure,                        the patient's blood pressure, pulse, and oxygen                        saturations were monitored continuously. The Endoscope                        was introduced through the mouth, and advanced to the                        second part of duodenum. The upper GI endoscopy was                        accomplished without difficulty. The patient tolerated                        the procedure well. Findings:      The examined esophagus was normal. GEJ at 30cm,, large hiatal hernia.      A large hiatal hernia was present.      Multiple dispersed, non-bleeding erosions were found in the gastric       body. There were no stigmata of recent bleeding. Spots and several       streaks noted.      The examined duodenum was normal. Impression:           - Normal esophagus.                       - Large hiatal hernia.                       -  Non-bleeding erosive gastropathy.                       - Normal examined duodenum.                 - No specimens collected. Recommendation:       - The findings and recommendations were discussed with                        the patient's family. start liquid diet and advance to                        full liquids, change to oral PPI tomorrow and possibly                        home tomorrow or next day. Scot Junobert T Elliott, MD 06/10/2015 10:08:49 AM This report has been signed electronically. Number of Addenda: 0 Note Initiated On: 06/10/2015 9:44 AM      [facility]

## 2015-06-10 NOTE — Progress Notes (Signed)
[facility] [place] [facility]                                                                                                                                                                                            Patient Demographics   Roberta Leon, is a 82 y.o. female, DOB - Feb 10, 1929, RUE:[id number]  Admit date - 06/09/2015   Admitting Physician Wyatt Haste, MD  Outpatient Primary MD for the patient is HANDE,VISHWANATH, MD   LOS - 1  Subjective:pt admited with melena, feels better after transfusion, no further blood in the stool     Review of Systems:   CONSTITUTIONAL: No documented fever. Positive fatigue, positive weakness. No weight gain, no weight loss.  EYES: No blurry or double vision.  ENT: No tinnitus. No postnasal drip. No redness of the oropharynx.  RESPIRATORY: No cough, no wheeze, no hemoptysis. No dyspnea.  CARDIOVASCULAR: No chest pain. No orthopnea. No palpitations. No syncope.  GASTROINTESTINAL: No nausea, no vomiting or diarrhea. No abdominal pain. Positive melena or no hematochezia.  GENITOURINARY: No dysuria or hematuria.  ENDOCRINE: No polyuria or nocturia. No heat or cold intolerance.  HEMATOLOGY: No anemia. No bruising. No bleeding.  INTEGUMENTARY: No rashes. No lesions.  MUSCULOSKELETAL: No arthritis. No swelling. No gout.  NEUROLOGIC: No numbness, tingling, or ataxia. No seizure-type activity.  PSYCHIATRIC: No anxiety. No insomnia. No ADD.    Vitals:   Filed Vitals:   06/09/15 2250 06/10/15 0116 06/10/15 0509 06/10/15 0941  BP: 133/51 139/59 122/42 185/71  Pulse: 65 70 67 97  Temp: 97.8 F (36.6 C) 98.6 F (37 C) 98.2 F (36.8 C) 97.9 F (36.6 C)  TempSrc: Oral Oral Oral Tympanic  Resp: Height:     (1.6 m)  Weight:    60.782 kg (134 lb)  SpO2: 99% 100% 97% 99%    Wt Readings from Last 3 Encounters:  06/10/15 60.782 kg (134 lb)     Intake/Output Summary (Last 24 hours) at  06/10/15 0943 Last data filed at 06/10/15 0734  Gross per 24 hour  Intake    677 ml  Output   1050 ml  Net   -373 ml    Physical Exam:   GENERAL: Pleasant-appearing in no apparent distress.  HEAD, EYES, EARS, NOSE AND THROAT: Atraumatic, normocephalic. Extraocular muscles are intact. Pupils equal and reactive to light. Sclerae anicteric. No conjunctival injection. No oro-pharyngeal erythema.  NECK: Supple. There is no jugular venous distention. No bruits, no lymphadenopathy, no thyromegaly.  HEART: Regular rate and rhythm,. No murmurs, no rubs, no clicks.  LUNGS: Clear to auscultation bilaterally. No rales or rhonchi. No wheezes.  ABDOMEN: Soft, flat, nontender, nondistended. Has good bowel sounds. No hepatosplenomegaly appreciated.  EXTREMITIES: No evidence of any cyanosis, clubbing, or peripheral edema.  +2 pedal and radial pulses bilaterally.  NEUROLOGIC: The patient is alert, awake, and oriented x3 with no focal motor or sensory deficits appreciated bilaterally.  SKIN: Moist and warm with no rashes appreciated.  Psych: Not anxious, depressed LN: No inguinal LN enlargement    Antibiotics   Anti-infectives    None      Medications   Scheduled Meds: . [MAR Hold] atorvastatin  40 mg Oral q1800  . [MAR Hold] enalapril  10 mg Oral Daily  . [MAR Hold] insulin aspart  0-5 Units Subcutaneous QHS  . [MAR Hold] insulin aspart  0-9 Units Subcutaneous TID WC  . [MAR Hold] metoprolol succinate  50 mg Oral Daily  . [MAR Hold] pantoprazole (PROTONIX) IV  40 mg Intravenous Q12H   Continuous Infusions:  PRN Meds:.[MAR Hold] acetaminophen **OR** [MAR Hold] acetaminophen, [MAR Hold]  morphine injection, [MAR Hold] ondansetron **OR** [MAR Hold] ondansetron (ZOFRAN) IV, [MAR Hold] oxyCODONE   Data Review:   Micro Results No results found for this or any previous visit (from the past 240 hour(s)).  Radiology Reports Dg Chest 2 View  06/09/2015  CLINICAL DATA:  Tachycardia and shortness  of breath EXAM: CHEST  2 VIEW COMPARISON:  None. FINDINGS: Cardiac shadow is within normal limits. A large hiatal hernia is seen. The lungs are well aerated bilaterally without focal infiltrate. No acute bony abnormality is noted. IMPRESSION: No acute abnormality seen. Electronically Signed   By: Alcide Clever M.D.   On: 06/09/2015 14:53     CBC  Recent Labs Lab 06/09/15 1405 06/10/15 0233  WBC 6.9 4.4  HGB 7.5* 8.9*  HCT 22.4* 25.7*  PLT 232 151  MCV 87.9 87.0  MCH 29.3 30.3  MCHC 33.3 34.8  RDW 15.5* 15.3*    Chemistries   Recent Labs Lab 06/09/15 1405 06/10/15 0233  NA 136 140  K 3.8 3.5  CL 104 113*  CO2 25 24  GLUCOSE 146* 94  BUN 17 13  CREATININE 0.65 0.62  CALCIUM 10.1 8.8*   ------------------------------------------------------------------------------------------------------------------ estimated creatinine clearance is 42.5 mL/min (by C-G formula based on Cr of 0.62). ------------------------------------------------------------------------------------------------------------------  Recent Labs  06/09/15 1405  HGBA1C UNABLE TO REPORT A1C DUE TO UNKNOWN   ------------------------------------------------------------------------------------------------------------------ No results for input(s): CHOL, HDL, LDLCALC, TRIG, CHOLHDL, LDLDIRECT in the last 72 hours. ------------------------------------------------------------------------------------------------------------------ No results for input(s): TSH, T4TOTAL, T3FREE, THYROIDAB in the last 72 hours.  Invalid input(s): FREET3 ------------------------------------------------------------------------------------------------------------------ No results for input(s): VITAMINB12, FOLATE, FERRITIN, TIBC, IRON, RETICCTPCT in the last 72 hours.  Coagulation profile No results for input(s): INR, PROTIME in the last 168 hours.  No results for input(s): DDIMER in the last 72 hours.  Cardiac Enzymes  Recent  Labs Lab 06/09/15 1405  TROPONINI <0.03   ------------------------------------------------------------------------------------------------------------------ Invalid input(s): POCBNP    Assessment & Plan   82 year old Caucasian female history of type 2 diabetes non-insulin-requiring who is presenting with palpitations and fatigue.  1. Symptomatic anemia secondary to upper GI bleed: Status post transfusion Hemoglobin is stable Continue Protonix IV twice a day Plan for endoscopy later today CBC tomorrow morning  2. Type 2 diabetes non-insulin-requiring oral therapy on hold due to nothing by mouth status blood sugar is relatively stable continue sliding scale insulin  3. Essential hypertension: Enalapril  4. Hyperlipidemia unspecified continue atorvastatin  5. Venous thromboembolism prophylactic: SCDs given active bleed      Code Status Orders        Start     Ordered   06/09/15 1555  Full code   Continuous     06/09/15 1556    Code Status History    Date Active Date Inactive Code Status Order ID Comments User Context   This patient has a current code status but no historical code status.    Advance Directive Documentation        Most Recent Value   Type of Advance Directive  Living will   Pre-existing out of facility DNR order (yellow form or pink MOST form)     "MOST" Form in Place?             Consults gastroenterology  DVT Prophylaxis  SCDs  Lab Results  Component Value Date   PLT 151 06/10/2015     Time Spent in minutes  35min  Greater than 50% of time spent in care coordination and counseling patient regarding the condition and plan of care.   Auburn BilberryPATEL, Dirk Vanaman M.D on 06/10/2015 at 9:43 AM  Between 7am to 6pm - Pager - [phone]  After 6pm go to [email or web address] - password [email or web address] [facility]  [facility]   Office  [phone]

## 2015-06-10 NOTE — Anesthesia Postprocedure Evaluation (Signed)
Anesthesia Post Note  Patient: Roberta Leon  Procedure(s) Performed: Procedure(s) (LRB): ESOPHAGOGASTRODUODENOSCOPY (EGD) (N/A)  Patient location during evaluation: Endoscopy Anesthesia Type: General Level of consciousness: awake and alert Pain management: pain level controlled Vital Signs Assessment: post-procedure vital signs reviewed and stable Respiratory status: spontaneous breathing Cardiovascular status: blood pressure returned to baseline Postop Assessment: no headache Anesthetic complications: no    Last Vitals:  Filed Vitals:   06/10/15 1030 06/10/15 1040  BP: 131/68 147/79  Pulse: 65 63  Temp:    Resp: 16 15    Last Pain: There were no vitals filed for this visit.               [name]

## 2015-06-10 NOTE — Consult Note (Signed)
Patient with linear erosions in body of stomach, some spotty small oval erosion/ulcers.  No blood in stomach, no clots.  Duodenum looked good.  Likely erosions from NSAID and she will be started on clear liquid diet and advance tomorrow to full liquids and home in 2 days.  I can't rule out other areas in GI tract that could contribute to her anemia but this is less likely to be also present.

## 2015-06-10 NOTE — OR Nursing (Signed)
Report to aleshia . Family in.

## 2015-06-10 NOTE — Anesthesia Preprocedure Evaluation (Signed)
Anesthesia Evaluation  Patient identified by MRN, date of birth, ID band Patient awake    Reviewed: Allergy & Precautions, NPO status , Patient's Chart, lab work & pertinent test results  Airway Mallampati: II  TM Distance: >3 FB Neck ROM: Full    Dental  (+) Teeth Intact   Pulmonary    Pulmonary exam normal        Cardiovascular hypertension, Pt. on medications and Pt. on home beta blockers Normal cardiovascular exam     Neuro/Psych    GI/Hepatic GERD  Medicated and Controlled,Upper GI bleed, Hb was 7.5, transfused, stable.   Endo/Other  diabetes, Type 2BG 94.  Renal/GU      Musculoskeletal   Abdominal   Peds  Hematology   Anesthesia Other Findings   Reproductive/Obstetrics                             Anesthesia Physical Anesthesia Plan  ASA: III and emergent  Anesthesia Plan: General   Post-op Pain Management:    Induction: Intravenous  Airway Management Planned: Nasal Cannula  Additional Equipment:   Intra-op Plan:   Post-operative Plan: Extubation in OR  Informed Consent: I have reviewed the patients History and Physical, chart, labs and discussed the procedure including the risks, benefits and alternatives for the proposed anesthesia with the patient or authorized representative who has indicated his/her understanding and acceptance.     Plan Discussed with: CRNA  Anesthesia Plan Comments:         Anesthesia Quick Evaluation

## 2015-06-10 NOTE — Transfer of Care (Signed)
Immediate Anesthesia Transfer of Care Note  Patient: Roberta Leon  Procedure(s) Performed: Procedure(s): ESOPHAGOGASTRODUODENOSCOPY (EGD) (N/A)  Patient Location: PACU and Short Stay  Anesthesia Type:General  Level of Consciousness: awake, oriented and patient cooperative  Airway & Oxygen Therapy: Patient Spontanous Breathing and Patient connected to nasal cannula oxygen  Post-op Assessment: Report given to RN and Post -op Vital signs reviewed and stable  Post vital signs: Reviewed and stable  Last Vitals:  Filed Vitals:   06/10/15 0509 06/10/15 0941  BP: 122/42 185/71  Pulse: 67 97  Temp: 36.8 C 36.6 C  Resp: 22 18    Complications: No apparent anesthesia complications

## 2015-06-11 ENCOUNTER — Encounter: Payer: Self-pay | Admitting: Unknown Physician Specialty

## 2015-06-11 LAB — TYPE AND SCREEN
ABO/RH(D): O NEG
ANTIBODY SCREEN: NEGATIVE
UNIT DIVISION: 0
Unit division: 0

## 2015-06-11 LAB — BASIC METABOLIC PANEL
ANION GAP: 4 — AB (ref 5–15)
BUN: 9 mg/dL (ref 6–20)
CHLORIDE: 110 mmol/L (ref 101–111)
CO2: 25 mmol/L (ref 22–32)
Calcium: 8.8 mg/dL — ABNORMAL LOW (ref 8.9–10.3)
Creatinine, Ser: 0.61 mg/dL (ref 0.44–1.00)
GFR calc Af Amer: >60 mL/min (ref >60)
GLUCOSE: 107 mg/dL — AB (ref 65–99)
POTASSIUM: 3.5 mmol/L (ref 3.5–5.1)
Sodium: 139 mmol/L (ref 135–145)

## 2015-06-11 LAB — CBC
HEMATOCRIT: 26.4 % — AB (ref 35.0–47.0)
HEMOGLOBIN: 9 g/dL — AB (ref 12.0–16.0)
MCH: 29.1 pg (ref 26.0–34.0)
MCHC: 33.9 g/dL (ref 32.0–36.0)
MCV: 85.8 fL (ref 80.0–100.0)
Platelets: 161 10*3/uL (ref 150–440)
RBC: 3.07 MIL/uL — AB (ref 3.80–5.20)
RDW: 15.9 % — ABNORMAL HIGH (ref 11.5–14.5)
WBC: 4.8 10*3/uL (ref 3.6–11.0)

## 2015-06-11 LAB — GLUCOSE, CAPILLARY
Glucose-Capillary: 100 mg/dL — ABNORMAL HIGH (ref 65–99)
Glucose-Capillary: 106 mg/dL — ABNORMAL HIGH (ref 65–99)

## 2015-06-11 LAB — MISC LABCORP TEST (SEND OUT): Labcorp test code: 1453

## 2015-06-11 MED ORDER — ESOMEPRAZOLE MAGNESIUM 40 MG PO CPDR: 40.0000 mg | DELAYED_RELEASE_CAPSULE | Freq: Two times a day (BID) | ORAL | Status: AC

## 2015-06-11 NOTE — Care Management Important Message (Signed)
Important Message  Patient Details  Name: Roberta Leon MRN: [id number] Date of Birth: Apr 01, 1928   Medicare Important Message Given:  Yes    Olegario MessierKathy A Verneal Wiers 06/11/2015, 10:22 AM

## 2015-06-11 NOTE — Discharge Instructions (Signed)
°  DIET:  Low fat diet ,low sodium diet  DISCHARGE CONDITION:  Stable  ACTIVITY:  Activity as tolerated  OXYGEN:  Home Oxygen: No.   Oxygen Delivery: room air  DISCHARGE LOCATION:  home    ADDITIONAL DISCHARGE INSTRUCTION:   If you experience worsening of your admission symptoms, develop shortness of breath, life threatening emergency, suicidal or homicidal thoughts you must seek medical attention immediately by calling 911 or calling your MD immediately  if symptoms less severe.  You Must read complete instructions/literature along with all the possible adverse reactions/side effects for all the Medicines you take and that have been prescribed to you. Take any new Medicines after you have completely understood and accpet all the possible adverse reactions/side effects.   Please note  You were cared for by a hospitalist during your hospital stay. If you have any questions about your discharge medications or the care you received while you were in the hospital after you are discharged, you can call the unit and asked to speak with the hospitalist on call if the hospitalist that took care of you is not available. Once you are discharged, your primary care physician will handle any further medical issues. Please note that NO REFILLS for any discharge medications will be authorized once you are discharged, as it is imperative that you return to your primary care physician (or establish a relationship with a primary care physician if you do not have one) for your aftercare needs so that they can reassess your need for medications and monitor your lab values.

## 2015-06-11 NOTE — Progress Notes (Signed)
Patient discharged home with family.  All discharge instructions reviewed and discharge paperwork given to patient.  Patient verbalized understanding.  IVs removed in tact. Medications gone over with patient with all questions and concerns addressed. Patient's daughter in law at bedside for transfer home. Chryl HeckSarah Donovan Persley RN.

## 2015-06-11 NOTE — Progress Notes (Signed)
NSR, possible discharge today; Dr. Sheryle Hailiamond notified; new order written. Windy Carinaurner,Marjorie Lussier K, RN 06/11/2015 3:01 AM

## 2015-06-12 NOTE — Discharge Summary (Signed)
[address] [place], 82 y.o., DOB October 31, 1928, MRN [id number]. Admission date: 06/09/2015 Discharge Date 06/12/2015 Primary MD Barbette Reichmann, MD Admitting Physician Wyatt Haste, MD  Admission Diagnosis  Palpitations [R00.2] Acute upper GI bleed [K92.2] Symptomatic anemia [D64.9]  Discharge Diagnosis   Active Problems:   Upper GI bleed  acute gastritis Acute blood loss anemia Diabetes type 2 Hypertension essential Hyperlipidemia unspecified       Hospital Course Roberta Leon is a 82 y.o. female with a known history of type 2 diabetes non-insulin-requiring, essential hypertension who is presenting with palpitations and fatigue. She describes progressive symptoms over approximately 1 week duration however worsened for the last day this includes palpitations which she feels most prominently upper chest and shoulder region. She describes having melena for one week total duration which she has become increasingly fatigued and weak for the time period but denies any frank chest pain or shortness of breath. On arrival to the emergency department basic labs reveal hemoglobin 7.5 it was 13 approximately 1 month ago. Patient was admitted for acute GI bleed. She was felt to have upper GI bleed based on her melena. She was seen in consultation by GI and they performed the EGD which showed nonbleeding erosive gastropathy likely related to NSAID use. Patient was transfuse her hemoglobin is stable now. GI recommended PPIs. Stopping of the NSAIDs. Patient is tolerating her diet and is stable for discharge.             Consults  GI  Significant Tests:  See full reports for all details      Dg Chest 2 View  06/09/2015  CLINICAL DATA:  Tachycardia and shortness of breath EXAM: CHEST  2 VIEW COMPARISON:  None. FINDINGS: Cardiac shadow is within normal limits. A large hiatal hernia is seen. The lungs are well aerated bilaterally without focal infiltrate. No acute bony abnormality is  noted. IMPRESSION: No acute abnormality seen. Electronically Signed   By: Alcide Clever M.D.   On: 06/09/2015 14:53       Today   Subjective:   Roberta Leon   feels well denies any complaints  Objective:   Blood pressure 157/63, pulse 68, temperature 97.6 F (36.4 C), temperature source Oral, resp. rate 17, height  (1.6 m), weight 60.782 kg (134 lb), SpO2 100 %.  . No intake or output data in the 24 hours ending 06/12/15 1349  Exam VITAL SIGNS: Blood pressure 157/63, pulse 68, temperature 97.6 F (36.4 C), temperature source Oral, resp. rate 17, height  (1.6 m), weight 60.782 kg (134 lb), SpO2 100 %.  GENERAL:  82 y.o.-year-old patient lying in the bed with no acute distress.  EYES: Pupils equal, round, reactive to light and accommodation. No scleral icterus. Extraocular muscles intact.  HEENT: Head atraumatic, normocephalic. Oropharynx and nasopharynx clear.  NECK:  Supple, no jugular venous distention. No thyroid enlargement, no tenderness.  LUNGS: Normal breath sounds bilaterally, no wheezing, rales,rhonchi or crepitation. No use of accessory muscles of respiration.  CARDIOVASCULAR: S1, S2 normal. No murmurs, rubs, or gallops.  ABDOMEN: Soft, nontender, nondistended. Bowel sounds present. No organomegaly or mass.  EXTREMITIES: No pedal edema, cyanosis, or clubbing.  NEUROLOGIC: Cranial nerves II through XII are intact. Muscle strength 5/5 in all extremities. Sensation intact. Gait not checked.  PSYCHIATRIC: The patient is alert and oriented x 3.  SKIN: No obvious rash, lesion, or ulcer.   Data Review     CBC w Diff:  Lab Results  Component Value  Date   WBC 4.8 06/11/2015   WBC 6.2 01/27/2013   HGB 9.0* 06/11/2015   HGB 15.2 01/27/2013   HCT 26.4* 06/11/2015   HCT 43.7 01/27/2013   PLT 161 06/11/2015   PLT 176 01/27/2013   CMP:  Lab Results  Component Value Date   NA 139 06/11/2015   NA 138 01/27/2013   K 3.5 06/11/2015   K 3.4* 01/27/2013   CL  110 06/11/2015   CL 103 01/27/2013   CO2 25 06/11/2015   CO2 31 01/27/2013   BUN 9 06/11/2015   BUN 14 01/27/2013   CREATININE 0.61 06/11/2015   CREATININE 0.77 01/27/2013  .  Micro Results No results found for this or any previous visit (from the past 240 hour(s)).   Code Status History    Date Active Date Inactive Code Status Order ID Comments User Context   06/09/2015  3:56 PM 06/11/2015  5:27 PM Full Code [id number]  Wyatt Hasteavid K Hower, MD ED    Advance Directive Documentation        Most Recent Value   Type of Advance Directive  Living will   Pre-existing out of facility DNR order (yellow form or pink MOST form)     "MOST" Form in Place?            Follow-up Information    Follow up with Lynnae PrudeELLIOTT, ROBERT, MD On 07/07/2016.   Specialty:  Gastroenterology   Why:  Monday May 8th 2017 at 10:30 a.m.   Contact information:   [address] [facility] [place] [address] [phone]       Follow up with Barbette ReichmannHANDE,VISHWANATH, MD On 06/18/2015.   Specialty:  Internal Medicine   Why:  Tuesday April 18th 2017 at 1:15 p.m.    Contact information:   [address] [facility] [place] [address] [phone]       Discharge Medications     Medication List    STOP taking these medications        aspirin EC 81 MG tablet      TAKE these medications        cholecalciferol 1000 units tablet  Commonly known as:  VITAMIN D  Take 1,000 Units by mouth daily.     enalapril 10 MG tablet  Commonly known as:  VASOTEC  Take 10 mg by mouth daily.     esomeprazole 40 MG capsule  Commonly known as:  NEXIUM  Take 1 capsule (40 mg total) by mouth 2 (two) times daily before a meal.     hydrochlorothiazide 12.5 MG capsule  Commonly known as:  MICROZIDE  Take 12.5 mg by mouth daily.     metFORMIN 500 MG tablet  Commonly known as:  GLUCOPHAGE  Take 500 mg by mouth 2 (two) times daily with a meal.     metoprolol succinate 50  MG 24 hr tablet  Commonly known as:  TOPROL-XL  Take 50 mg by mouth daily. Take with or immediately following a meal.     simvastatin 80 MG tablet  Commonly known as:  ZOCOR  Take 80 mg by mouth daily.     vitamin B-12 1000 MCG tablet  Commonly known as:  CYANOCOBALAMIN  Take 1,000 mcg by mouth daily.           Total Time in preparing paper work, data evaluation and todays exam - 35 minutes  Auburn BilberryPATEL, Tesla Bochicchio M.D on 06/12/2015 at 1:49 PM  [facility]  Office  [phone]

## 2016-08-31 ENCOUNTER — Other Ambulatory Visit: Payer: Self-pay | Admitting: Internal Medicine

## 2016-08-31 DIAGNOSIS — Z1231 Encounter for screening mammogram for malignant neoplasm of breast: Secondary | ICD-10-CM

## 2016-09-18 ENCOUNTER — Ambulatory Visit
Admission: RE | Admit: 2016-09-18 | Discharge: 2016-09-18 | Disposition: A | Discharge status: Home / Self Care | Payer: Medicare Other | Source: Ambulatory Visit | Attending: Internal Medicine | Admitting: Internal Medicine

## 2016-09-18 DIAGNOSIS — Z1231 Encounter for screening mammogram for malignant neoplasm of breast: Secondary | ICD-10-CM | POA: Diagnosis not present

## 2016-09-18 DIAGNOSIS — N6489 Other specified disorders of breast: Secondary | ICD-10-CM | POA: Insufficient documentation

## 2016-09-18 DIAGNOSIS — R928 Other abnormal and inconclusive findings on diagnostic imaging of breast: Secondary | ICD-10-CM | POA: Diagnosis not present

## 2016-09-18 IMAGING — MG MM DIGITAL SCREENING BILAT W/ CAD
7 series · 7 of 7 positions shown · non-contrast
Comparison: Previous exam(s).

CLINICAL DATA: Screening.

EXAM:
DIGITAL SCREENING BILATERAL MAMMOGRAM WITH CAD

[L XCCL]
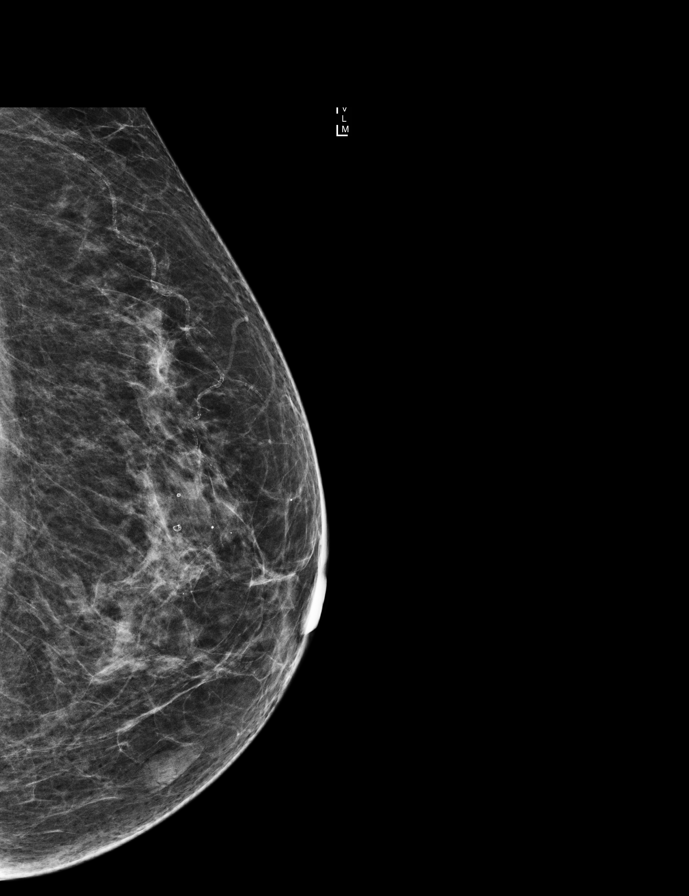

[R MLO]
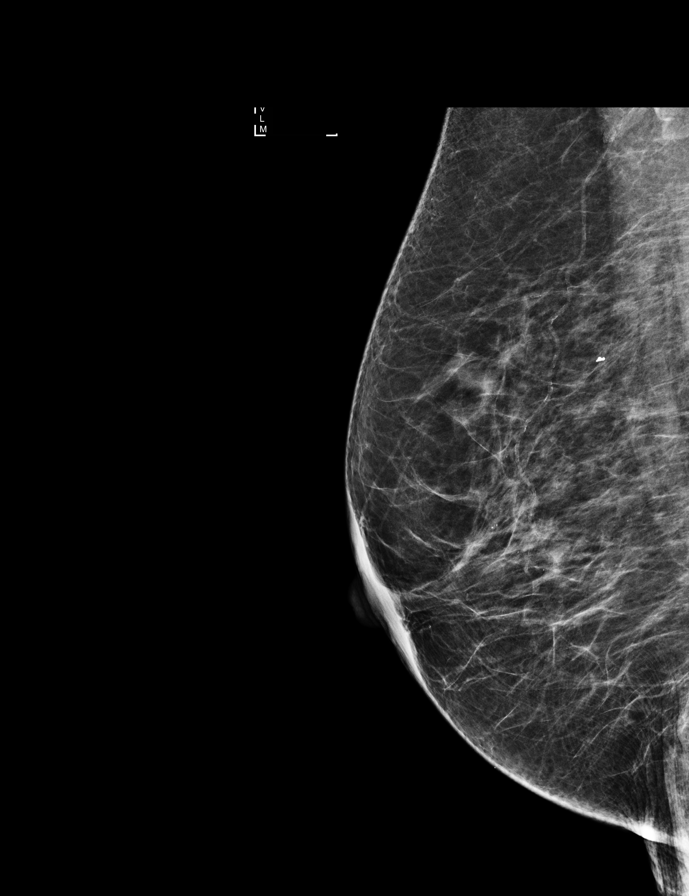

[L MLO (1 of 2)]
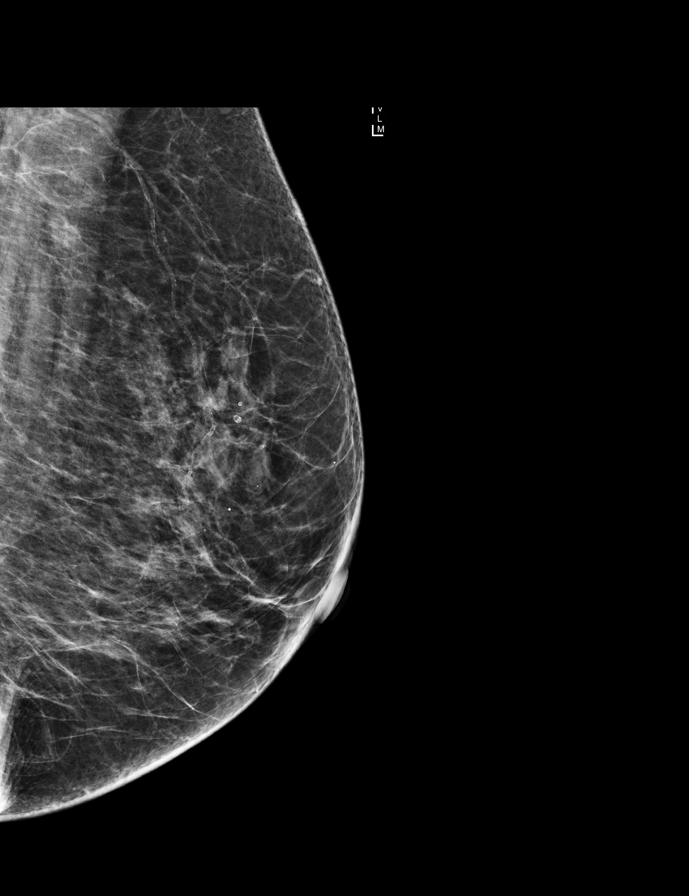

[L CC]
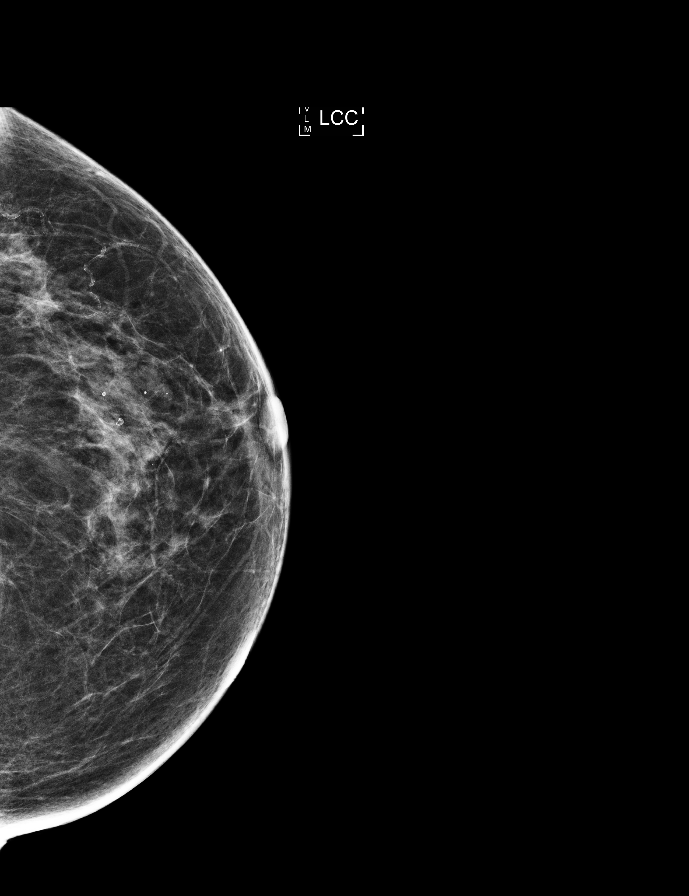

[R CC]
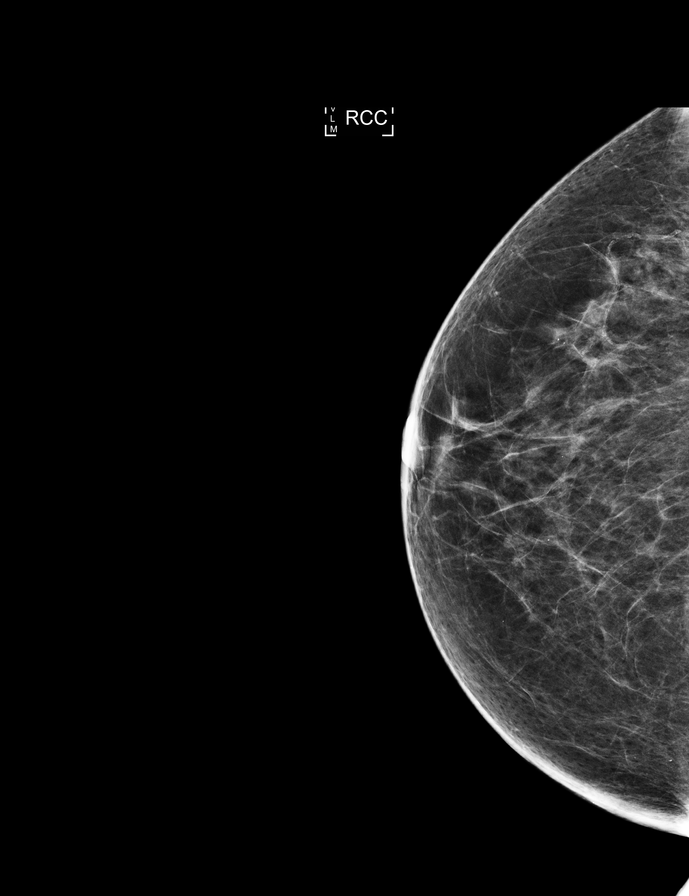

[R XCCL]
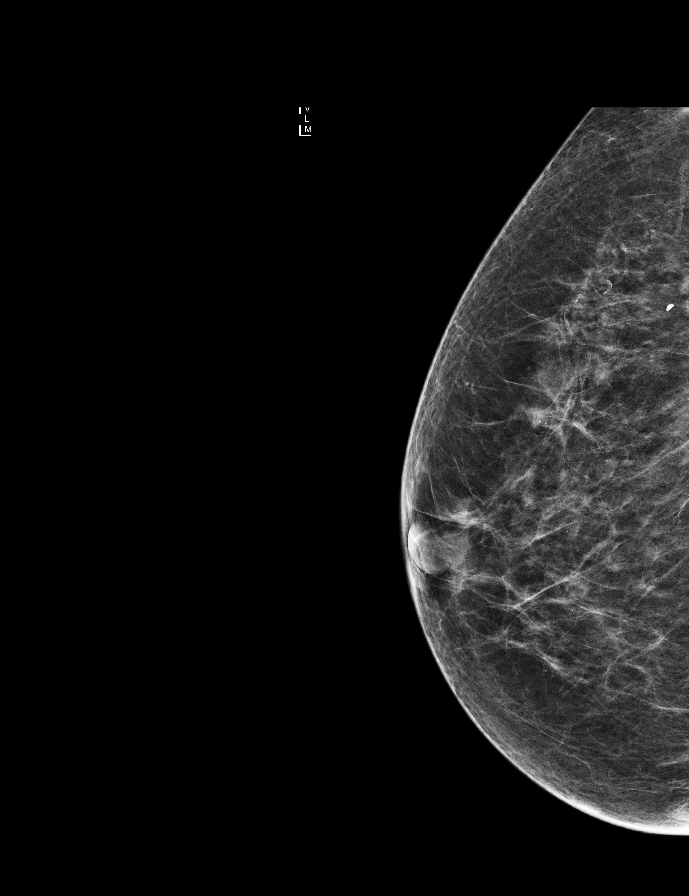

[L MLO (2 of 2)]
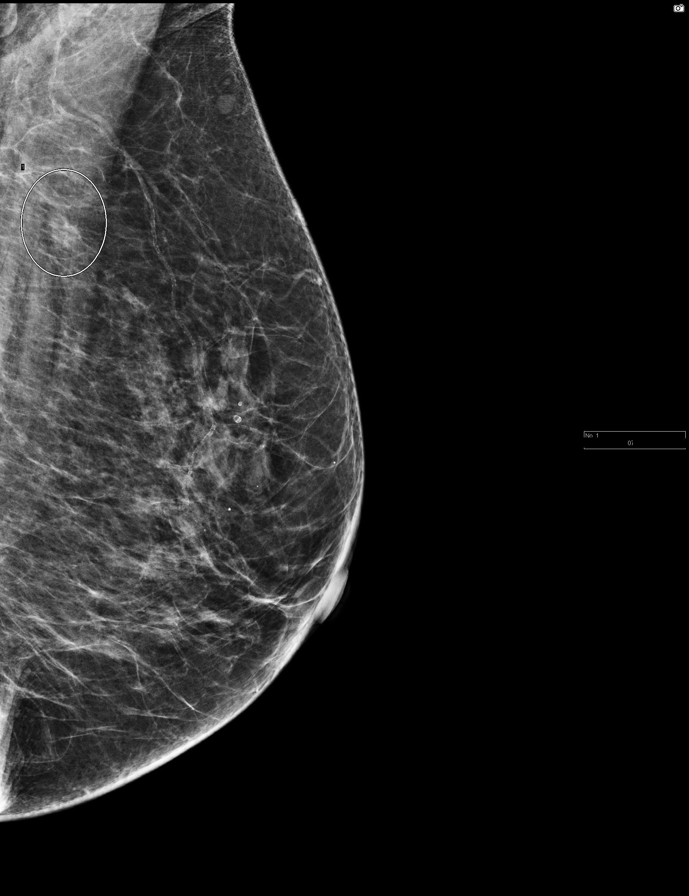

[7 of 7 positions shown; findings below may reference images not displayed]

ACR Breast Density Category b: There are scattered areas of
fibroglandular density.
FINDINGS: In the left breast, a possible asymmetry warrants further
evaluation. In the right breast, no findings suspicious for
malignancy. Images were processed with CAD.
IMPRESSION: Further evaluation is suggested for possible asymmetry in the left
breast.

RECOMMENDATION:
Diagnostic mammogram and possibly ultrasound of the left breast.
(Code:6F-[id number])

The patient will be contacted regarding the findings, and additional
imaging will be scheduled.

BI-RADS CATEGORY  0: Incomplete. Need additional imaging evaluation
and/or prior mammograms for comparison.

## 2016-09-24 ENCOUNTER — Inpatient Hospital Stay
Admission: RE | Admit: 2016-09-24 | Discharge: 2016-09-24 | Disposition: A | Discharge status: Home / Self Care | Payer: Self-pay | Source: Ambulatory Visit | Attending: *Deleted | Admitting: *Deleted

## 2016-09-24 ENCOUNTER — Other Ambulatory Visit: Payer: Self-pay | Admitting: *Deleted

## 2016-09-24 DIAGNOSIS — Z9289 Personal history of other medical treatment: Secondary | ICD-10-CM

## 2016-09-28 ENCOUNTER — Other Ambulatory Visit: Payer: Self-pay | Admitting: Internal Medicine

## 2016-09-28 DIAGNOSIS — N6489 Other specified disorders of breast: Secondary | ICD-10-CM

## 2016-09-28 DIAGNOSIS — R928 Other abnormal and inconclusive findings on diagnostic imaging of breast: Secondary | ICD-10-CM

## 2016-10-02 ENCOUNTER — Ambulatory Visit
Admission: RE | Admit: 2016-10-02 | Discharge: 2016-10-02 | Disposition: A | Discharge status: Home / Self Care | Payer: Medicare Other | Source: Ambulatory Visit | Attending: Internal Medicine | Admitting: Internal Medicine

## 2016-10-02 DIAGNOSIS — N6489 Other specified disorders of breast: Secondary | ICD-10-CM

## 2016-10-02 DIAGNOSIS — R928 Other abnormal and inconclusive findings on diagnostic imaging of breast: Secondary | ICD-10-CM

## 2016-10-02 IMAGING — MG MM DIGITAL DIAGNOSTIC UNILAT*L* W/ TOMO W/ CAD
6 series · 6 of 14 positions shown · non-contrast
Comparison: Previous exam(s).

CLINICAL DATA: Screening recall for a possible asymmetry noted in
the left breast on the MLO view only.

EXAM:
2D DIGITAL DIAGNOSTIC UNILATERAL LEFT MAMMOGRAM WITH CAD AND ADJUNCT
TOMO

[L MLO]
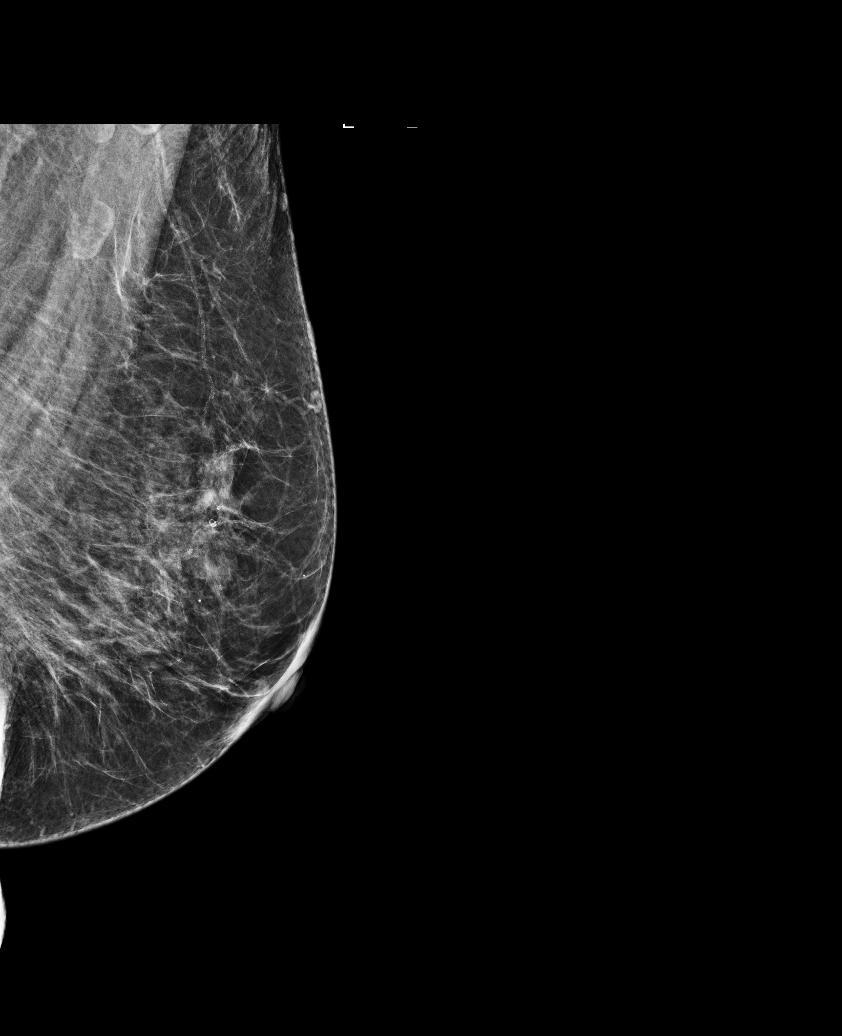

[L MLO synth-2D]
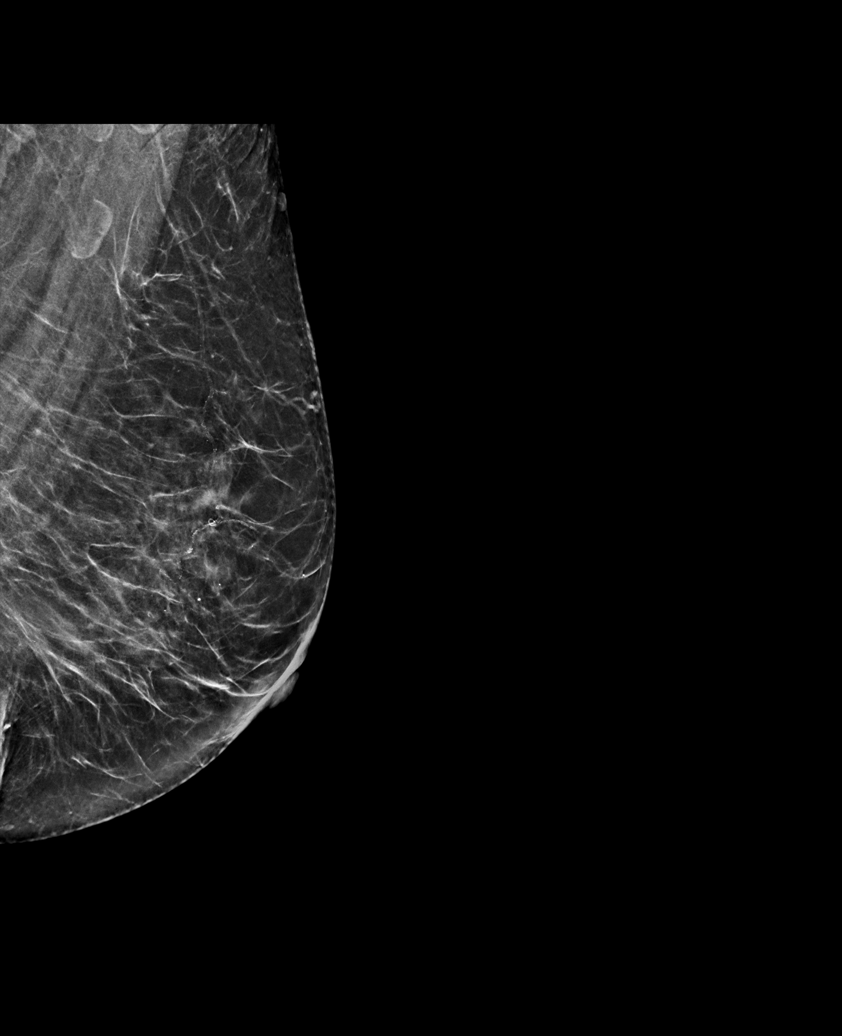

[L CC]
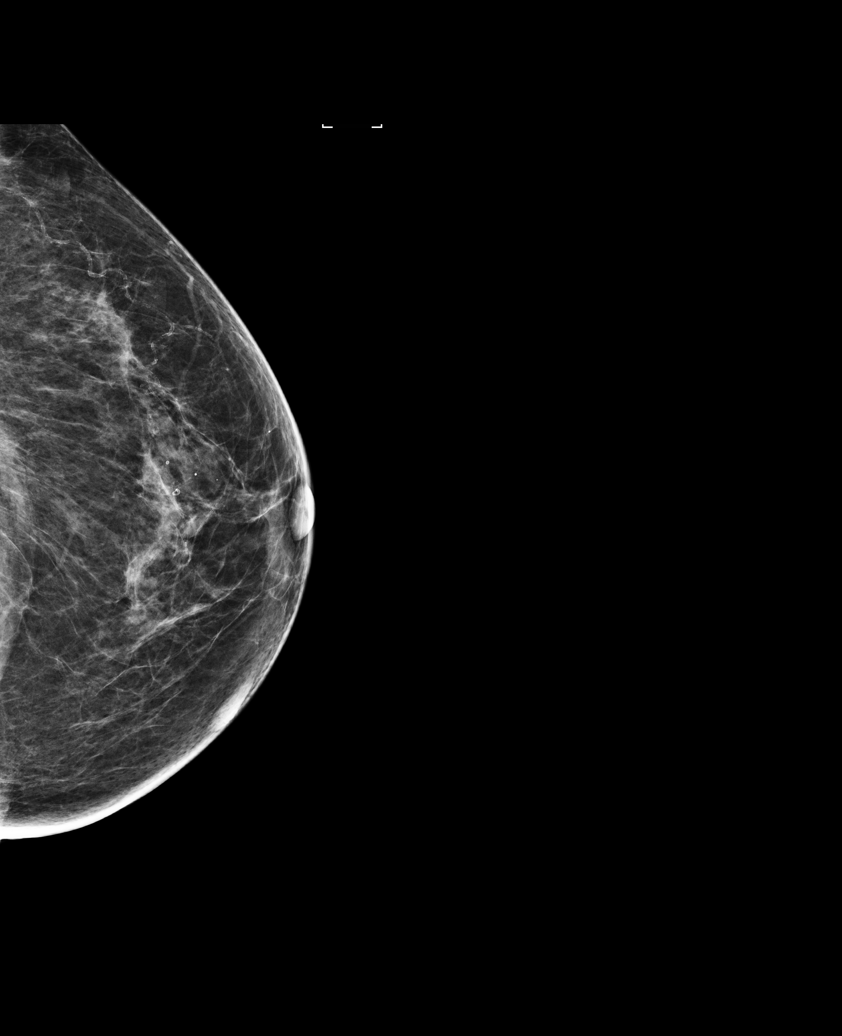

[L CC synth-2D]
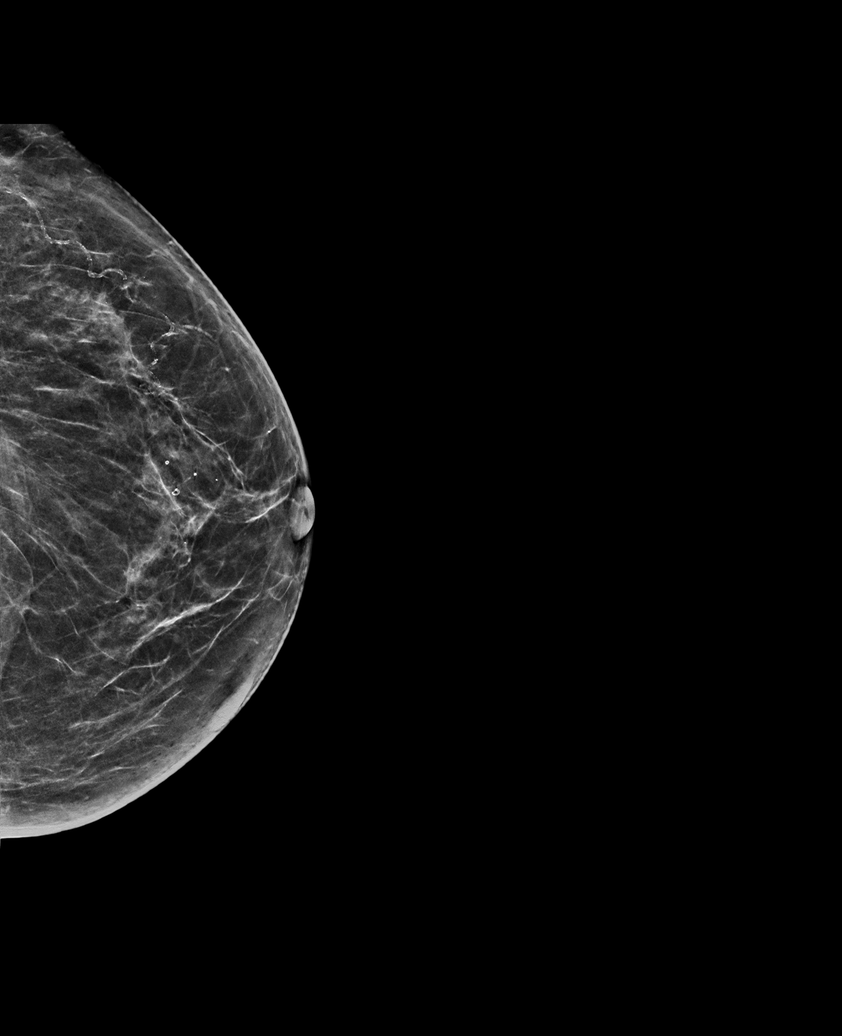

[L MLO tomo · tomo slice 34/67.0]
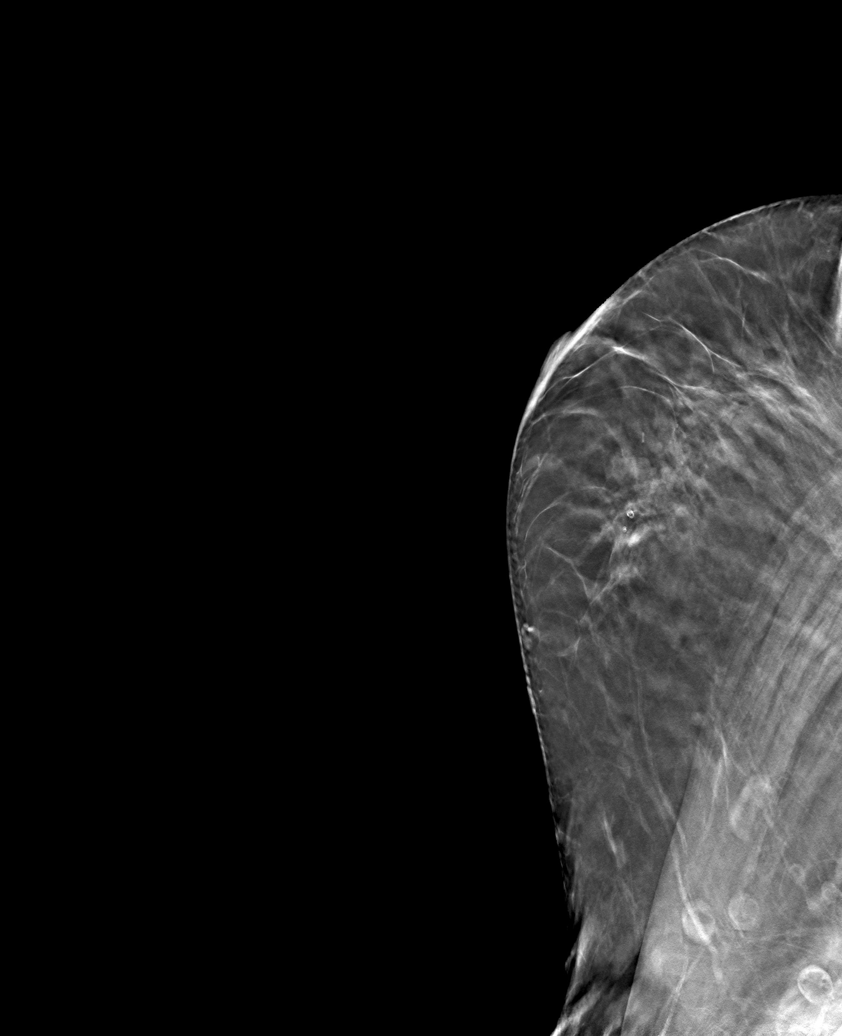

[L CC tomo · tomo slice 29/57.0]
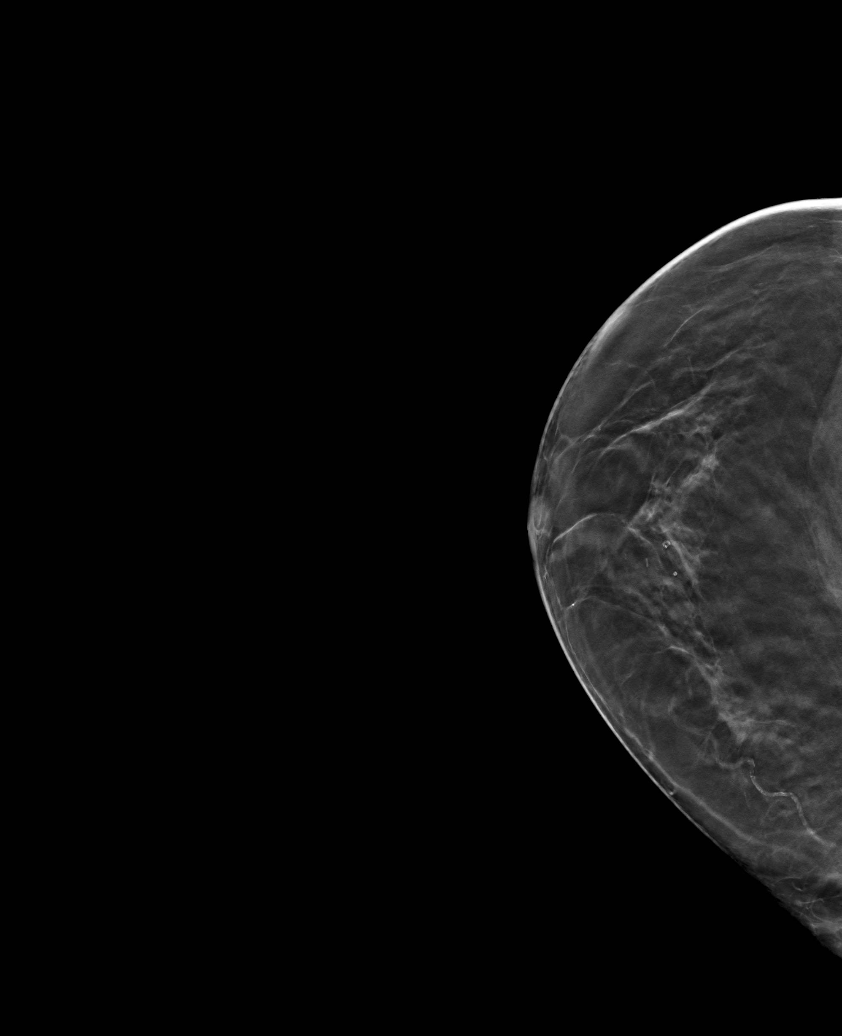

[6 of 14 positions shown; findings below may reference images not displayed]

ACR Breast Density Category b: There are scattered areas of
fibroglandular density.
FINDINGS: On the diagnostic 2D and 3D images, the possible asymmetry seen on
screening study is not reproduced. There is no significant
asymmetry. There are no discrete masses or areas of architectural
distortion. There are no suspicious calcifications.

Mammographic images were processed with CAD.
IMPRESSION: 1. No evidence of malignancy. The possible asymmetry noted on the
screening study was due to superimposed fibroglandular tissue.

RECOMMENDATION:
Screening mammogram in one year.(Code:[id number])

I have discussed the findings and recommendations with the patient.
Results were also provided in writing at the conclusion of the
visit. If applicable, a reminder letter will be sent to the patient
regarding the next appointment.

BI-RADS CATEGORY  1: Negative.

## 2017-08-26 ENCOUNTER — Other Ambulatory Visit: Payer: Self-pay | Admitting: Internal Medicine

## 2017-08-26 DIAGNOSIS — Z1231 Encounter for screening mammogram for malignant neoplasm of breast: Secondary | ICD-10-CM

## 2017-10-04 ENCOUNTER — Ambulatory Visit
Admission: RE | Admit: 2017-10-04 | Discharge: 2017-10-04 | Disposition: A | Discharge status: Home / Self Care | Payer: Medicare Other | Source: Ambulatory Visit | Attending: Internal Medicine | Admitting: Internal Medicine

## 2017-10-04 DIAGNOSIS — Z1231 Encounter for screening mammogram for malignant neoplasm of breast: Secondary | ICD-10-CM | POA: Insufficient documentation

## 2017-10-04 IMAGING — MG MM DIGITAL SCREENING BILAT W/ TOMO W/ CAD
8 series · 9 of 24 positions shown · non-contrast
Comparison: Previous exam(s).

CLINICAL DATA: Screening.

EXAM:
DIGITAL SCREENING BILATERAL MAMMOGRAM WITH TOMO AND CAD

[R CC synth-2D]
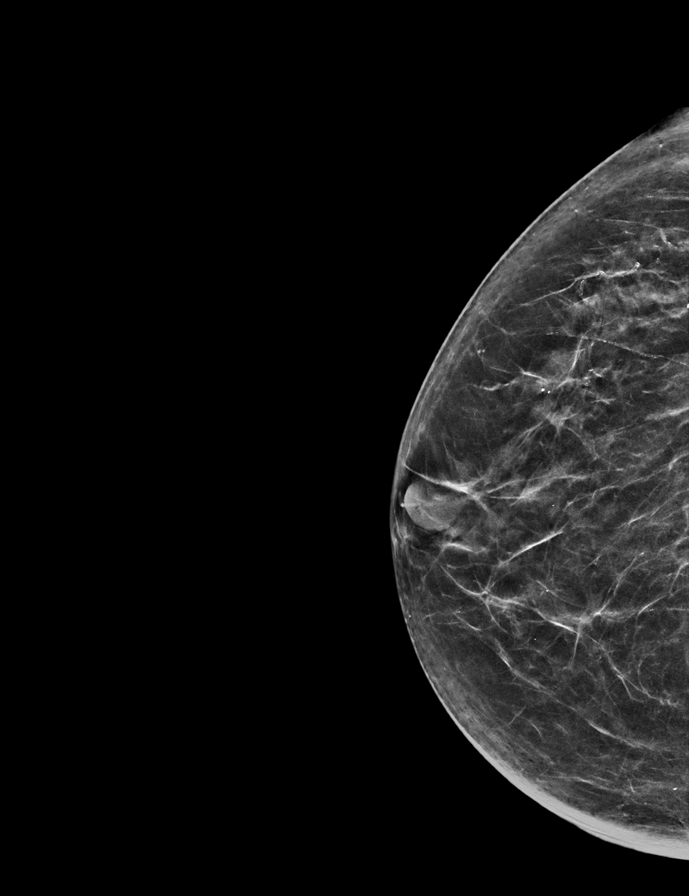

[L CC synth-2D]
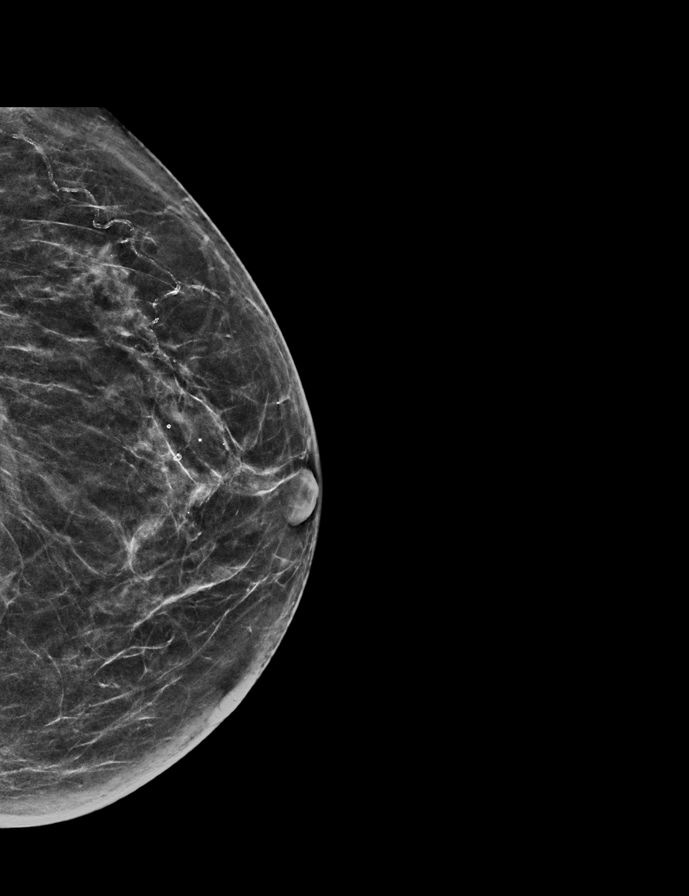

[L MLO synth-2D]
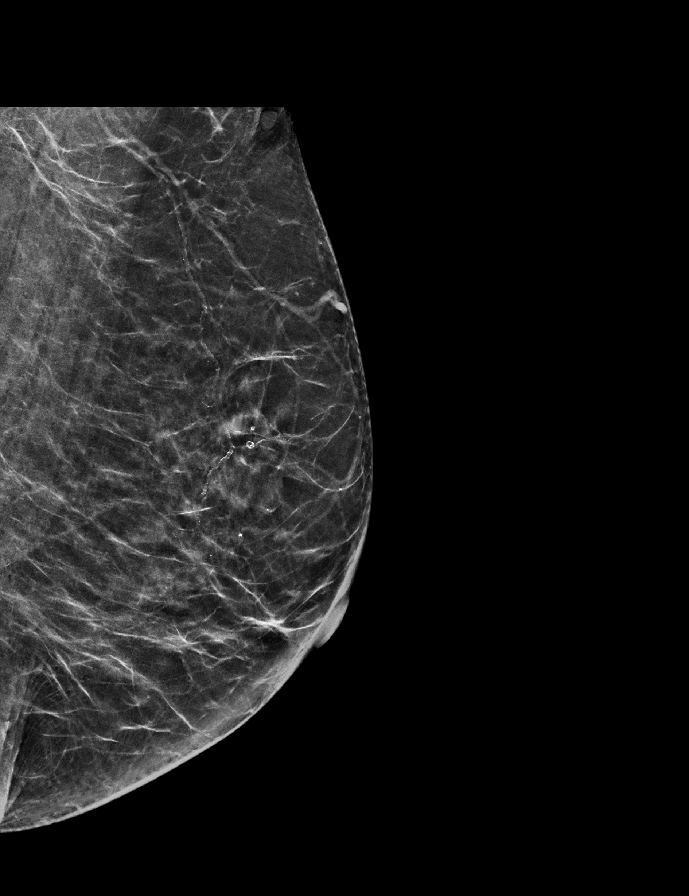

[R MLO synth-2D]
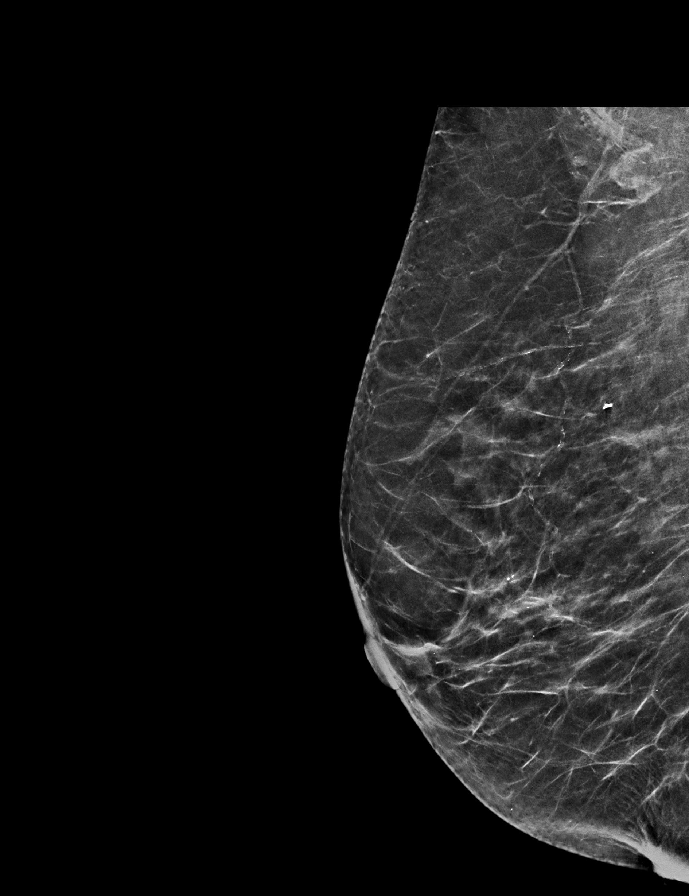

[L CC tomo · 2 of 55 frames shown]
[frame 18/55]
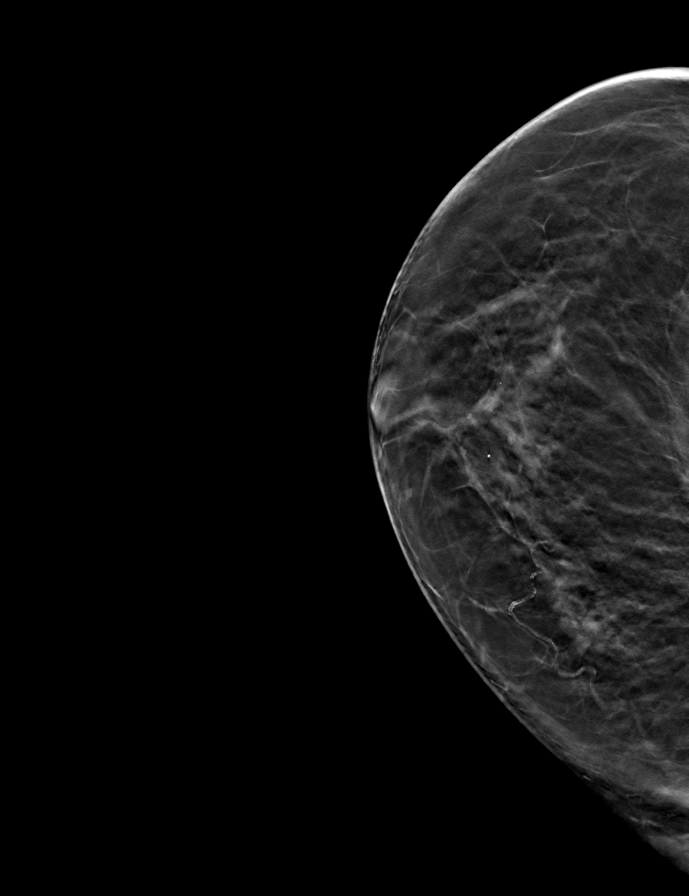
[frame 28/55]
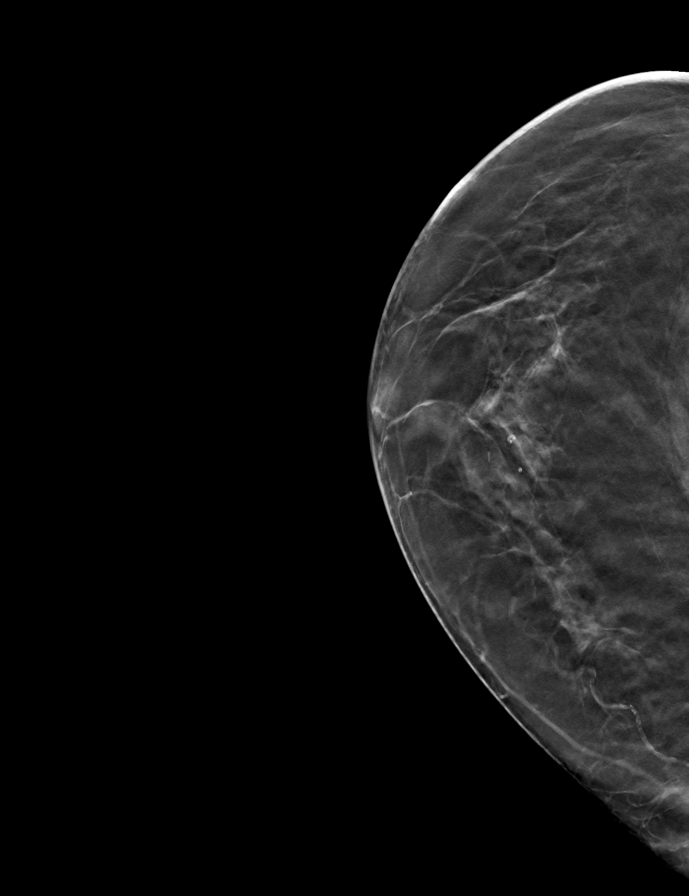

[R CC tomo · tomo slice 29/56.0]
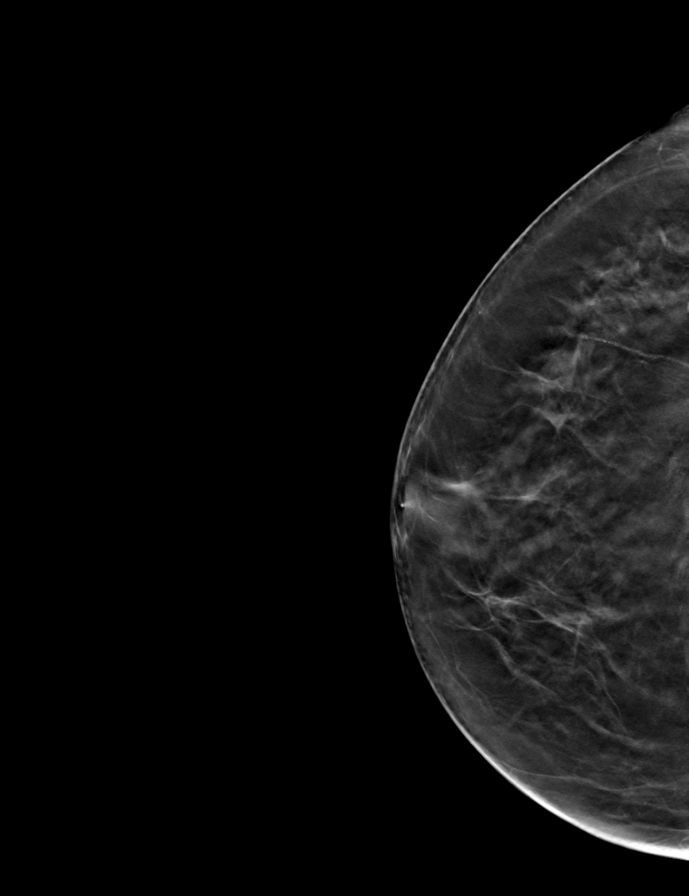

[R MLO tomo · tomo slice 29/56.0]
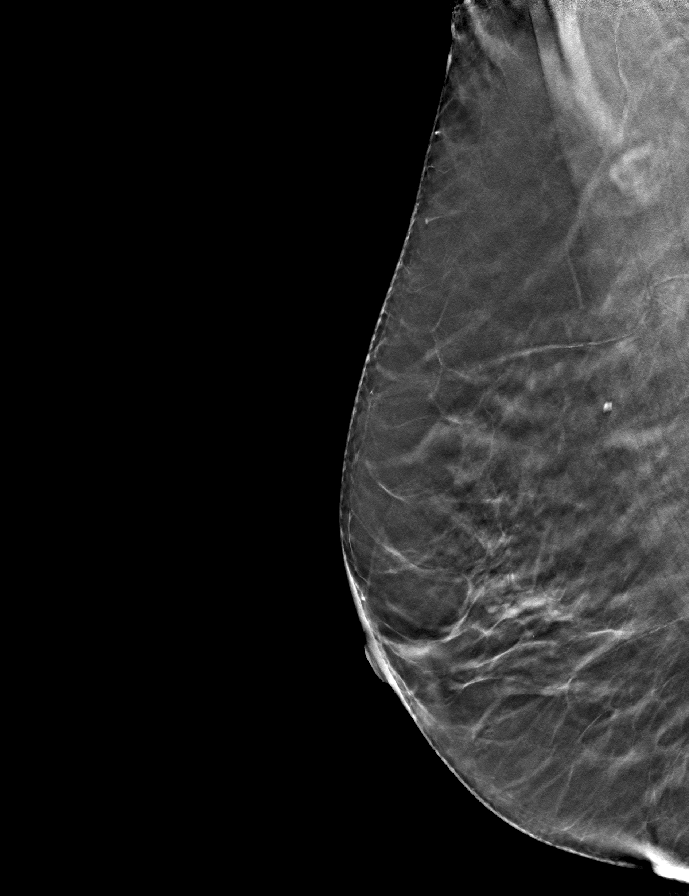

[L MLO tomo · tomo slice 29/56.0]
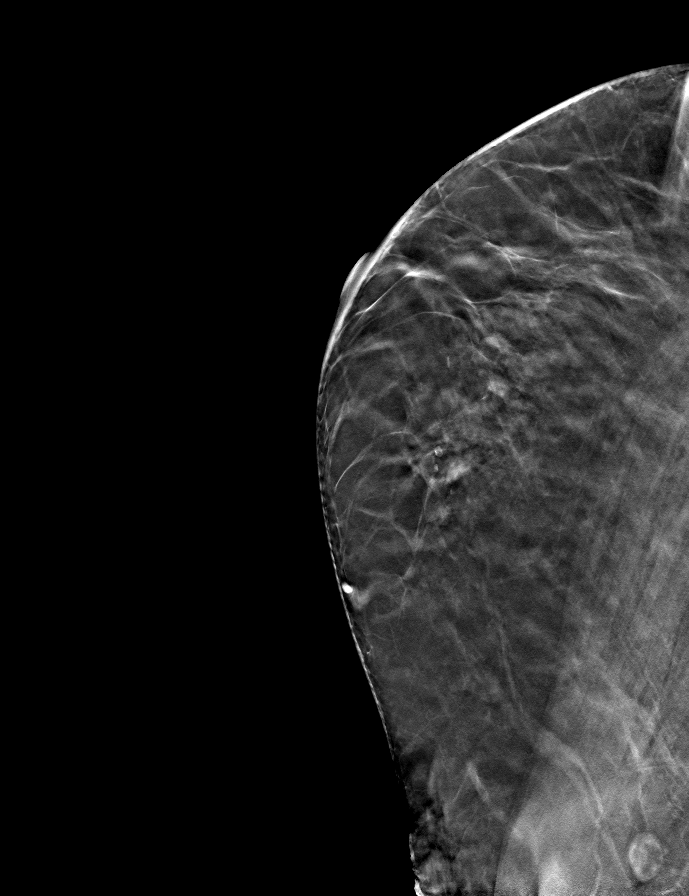

[9 of 24 positions shown; findings below may reference images not displayed]

ACR Breast Density Category b: There are scattered areas of
fibroglandular density.
FINDINGS: There are no findings suspicious for malignancy. Images were
processed with CAD.
IMPRESSION: No mammographic evidence of malignancy. A result letter of this
screening mammogram will be mailed directly to the patient.

RECOMMENDATION:
Screening mammogram in one year. (Code:[id number])

BI-RADS CATEGORY  1: Negative.

## 2018-08-09 ENCOUNTER — Other Ambulatory Visit: Payer: Self-pay | Admitting: Internal Medicine

## 2018-08-09 DIAGNOSIS — Z1231 Encounter for screening mammogram for malignant neoplasm of breast: Secondary | ICD-10-CM

## 2018-10-19 ENCOUNTER — Ambulatory Visit
Admission: RE | Admit: 2018-10-19 | Discharge: 2018-10-19 | Disposition: A | Discharge status: Home / Self Care | Payer: Medicare HMO | Source: Ambulatory Visit | Attending: Internal Medicine | Admitting: Internal Medicine

## 2018-10-19 ENCOUNTER — Other Ambulatory Visit: Payer: Self-pay

## 2018-10-19 DIAGNOSIS — Z1231 Encounter for screening mammogram for malignant neoplasm of breast: Secondary | ICD-10-CM | POA: Insufficient documentation

## 2018-10-19 IMAGING — MG DIGITAL SCREENING BILATERAL MAMMOGRAM WITH TOMO AND CAD
6 of 10 series · 6 of 30 positions shown · non-contrast
Comparison: Previous exam(s).

CLINICAL DATA: Screening.

EXAM:
DIGITAL SCREENING BILATERAL MAMMOGRAM WITH TOMO AND CAD

[R MLO synth-2D]
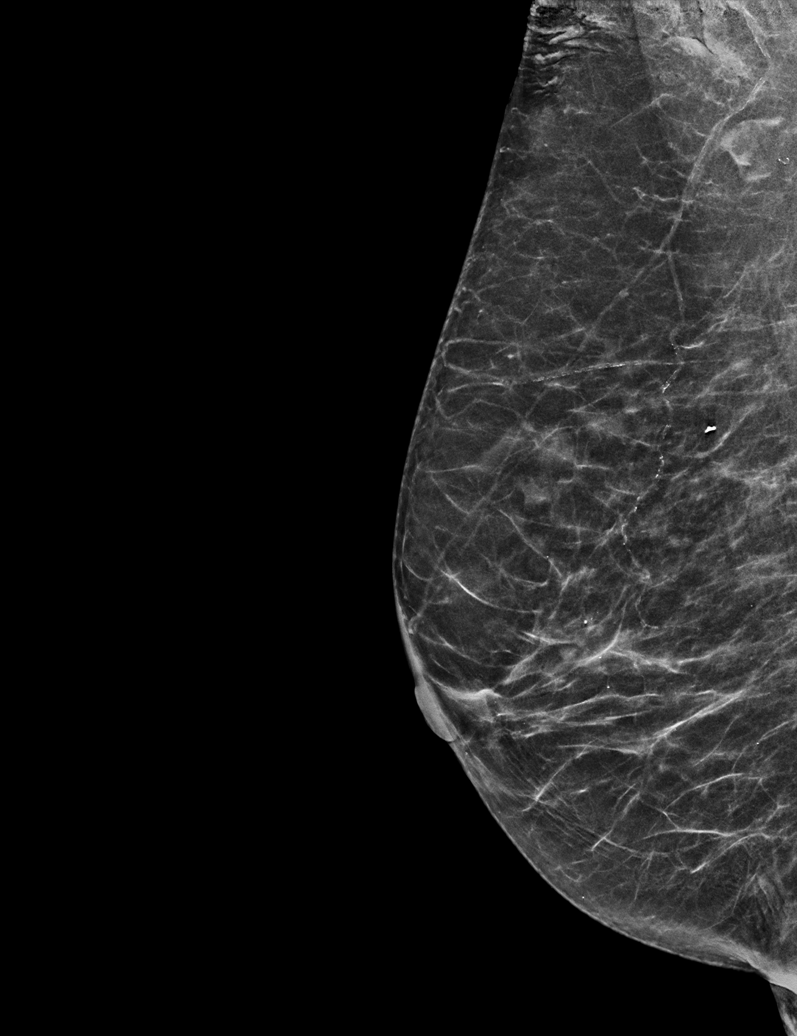

[R CC synth-2D (1 of 2)]
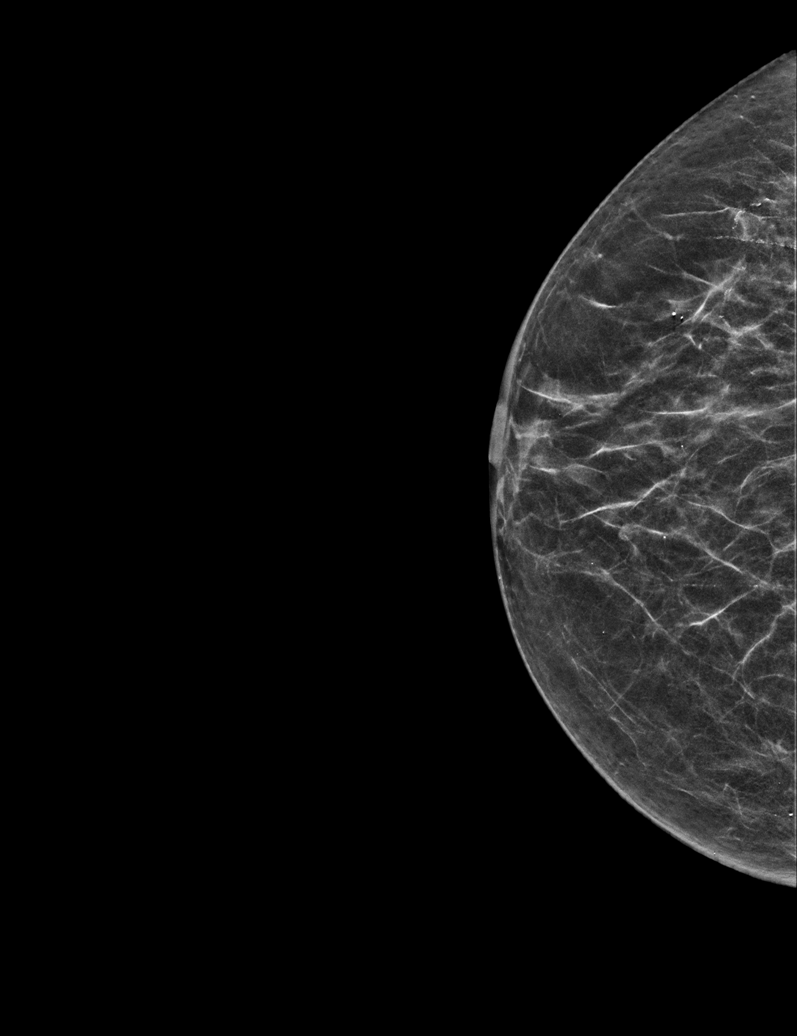

[R CC synth-2D (2 of 2)]
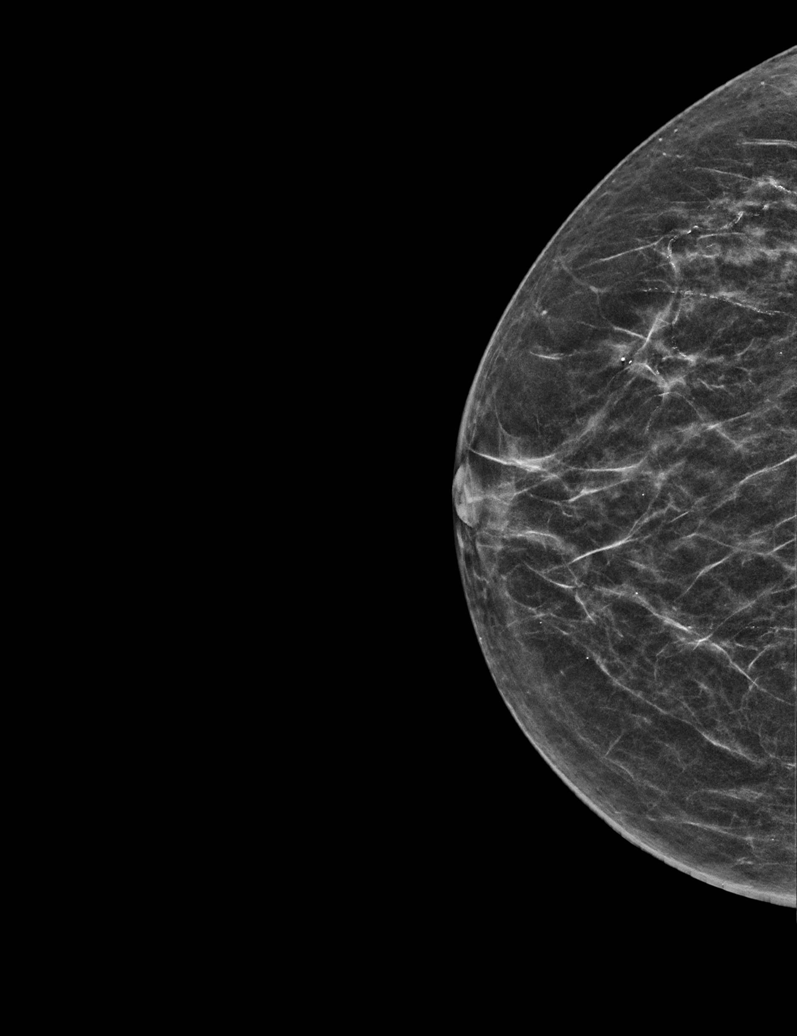

[L CC synth-2D]
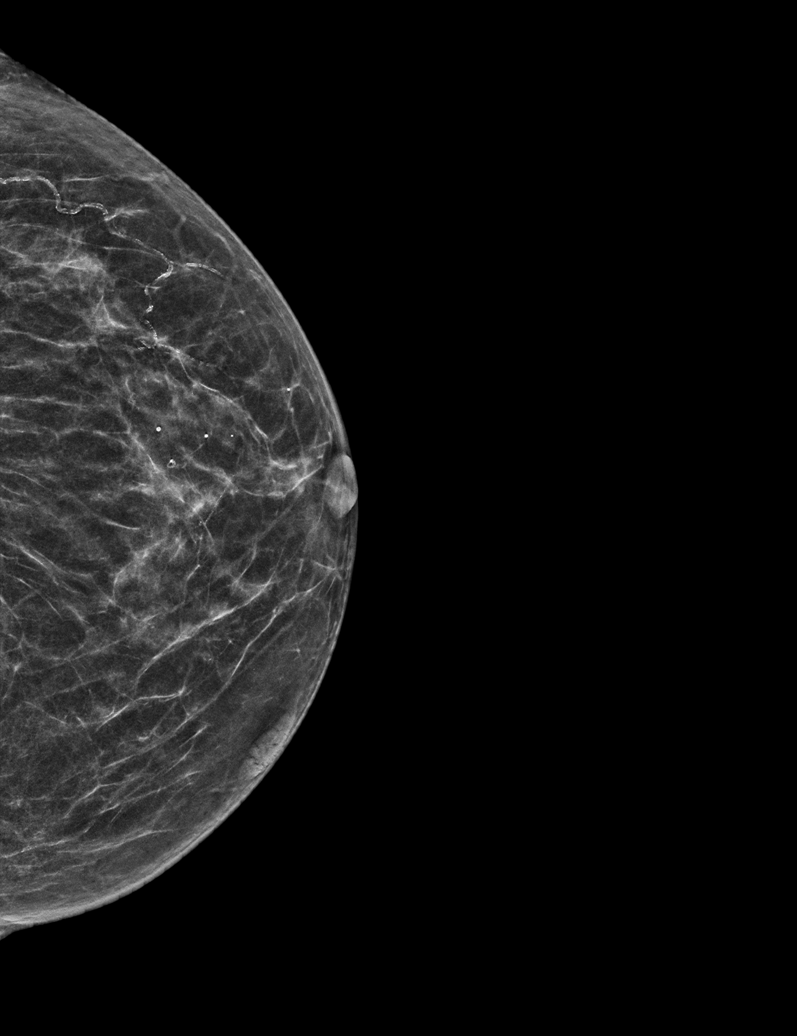

[L MLO synth-2D]
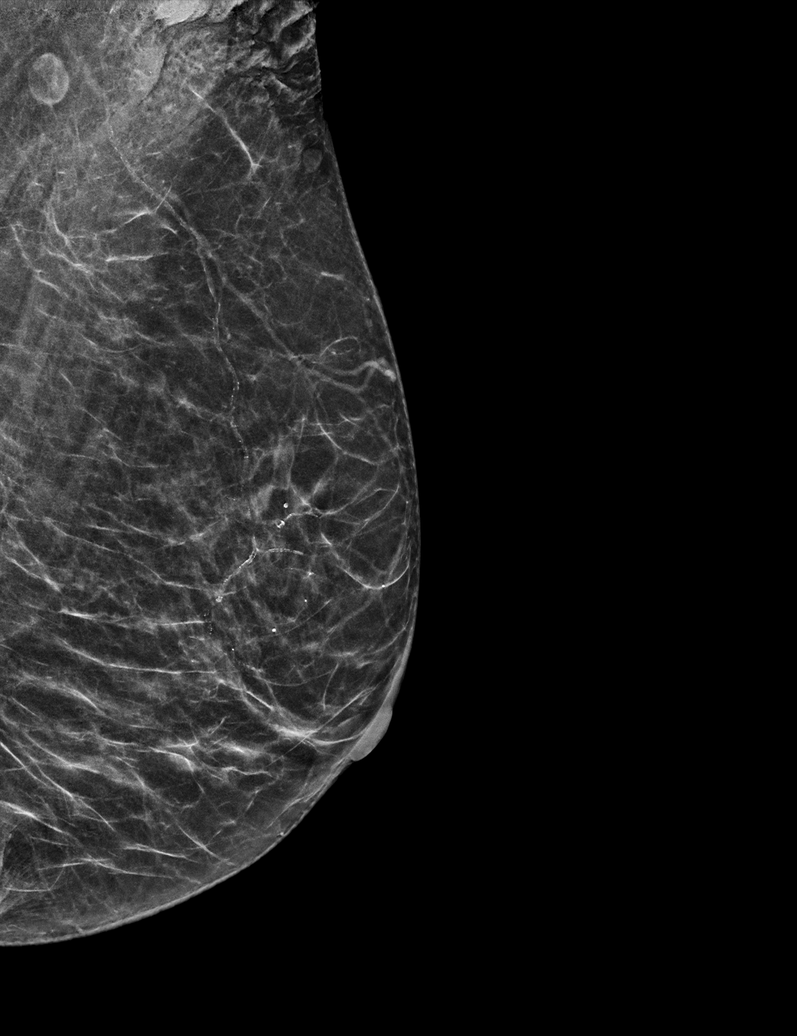

[L MLO tomo · tomo slice 24/47.0]
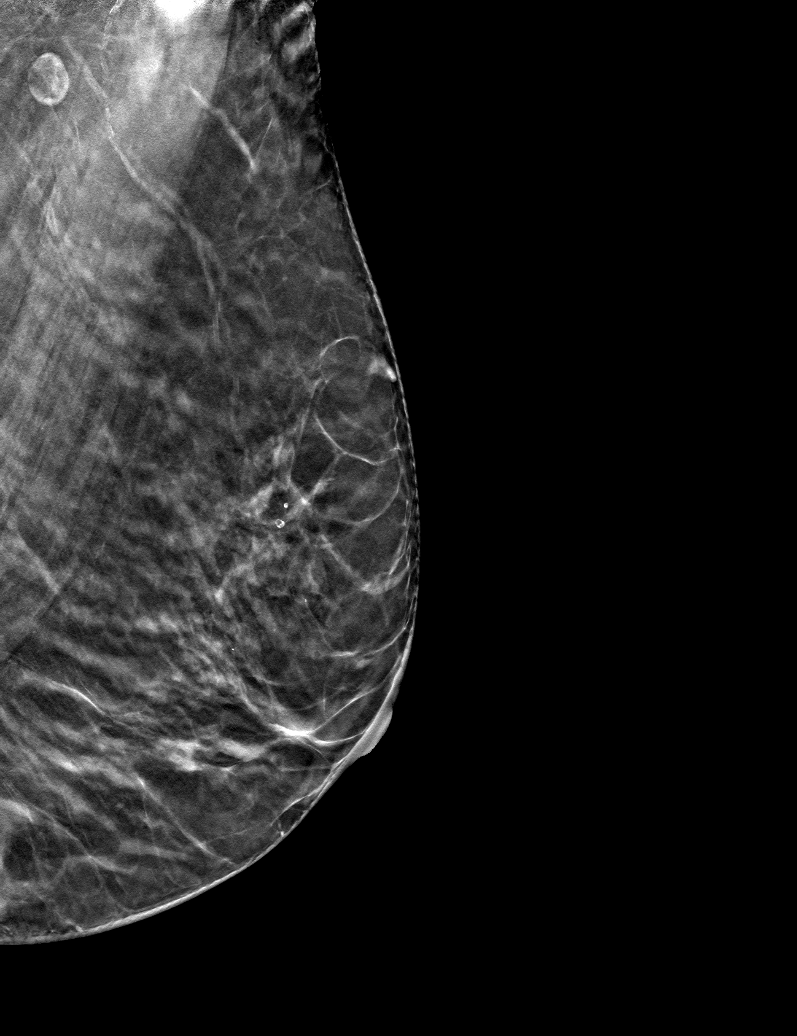

[6 of 30 positions shown; findings below may reference images not displayed]

ACR Breast Density Category b: There are scattered areas of
fibroglandular density.
FINDINGS: There are no findings suspicious for malignancy. Images were
processed with CAD.
IMPRESSION: No mammographic evidence of malignancy. A result letter of this
screening mammogram will be mailed directly to the patient.

RECOMMENDATION:
Screening mammogram in one year. (Code:[id number])

BI-RADS CATEGORY  1: Negative.

## 2019-09-14 ENCOUNTER — Other Ambulatory Visit: Payer: Self-pay | Admitting: Internal Medicine

## 2019-09-14 DIAGNOSIS — Z1231 Encounter for screening mammogram for malignant neoplasm of breast: Secondary | ICD-10-CM

## 2020-02-02 ENCOUNTER — Other Ambulatory Visit: Payer: Self-pay

## 2020-02-02 ENCOUNTER — Ambulatory Visit
Admission: RE | Admit: 2020-02-02 | Discharge: 2020-02-02 | Disposition: A | Discharge status: Home / Self Care | Payer: Medicare HMO | Source: Ambulatory Visit | Attending: Internal Medicine | Admitting: Internal Medicine

## 2020-02-02 DIAGNOSIS — Z1231 Encounter for screening mammogram for malignant neoplasm of breast: Secondary | ICD-10-CM | POA: Insufficient documentation

## 2020-02-02 IMAGING — MG DIGITAL SCREENING BILAT W/ TOMO W/ CAD
8 of 14 series · 8 of 40 positions shown · non-contrast
Comparison: Previous exam(s).

CLINICAL DATA: Screening.

EXAM:
DIGITAL SCREENING BILATERAL MAMMOGRAM WITH TOMO AND CAD

[R CC synth-2D (1 of 3)]
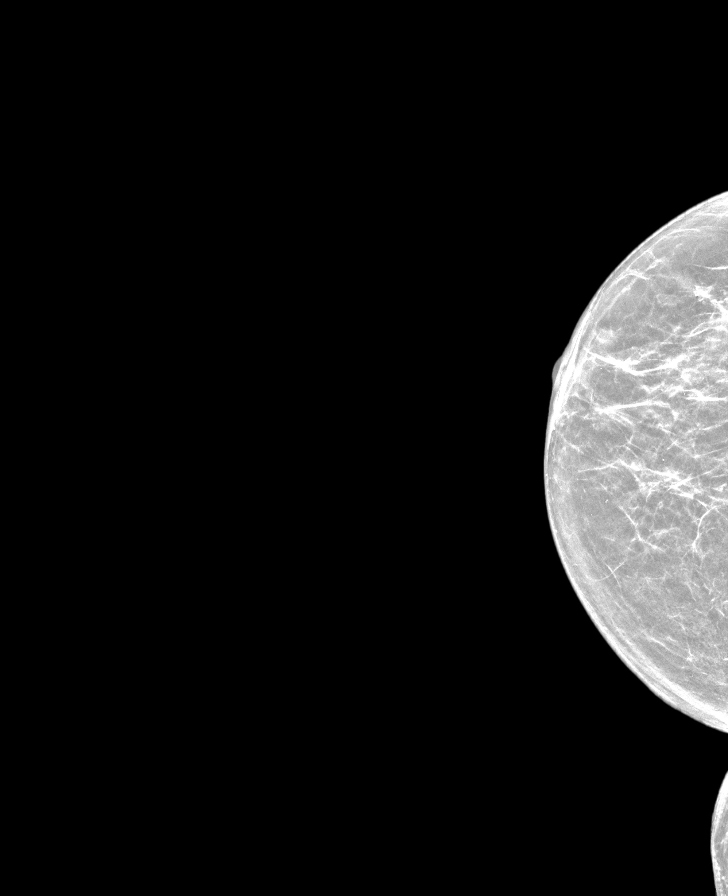

[L CC synth-2D (1 of 2)]
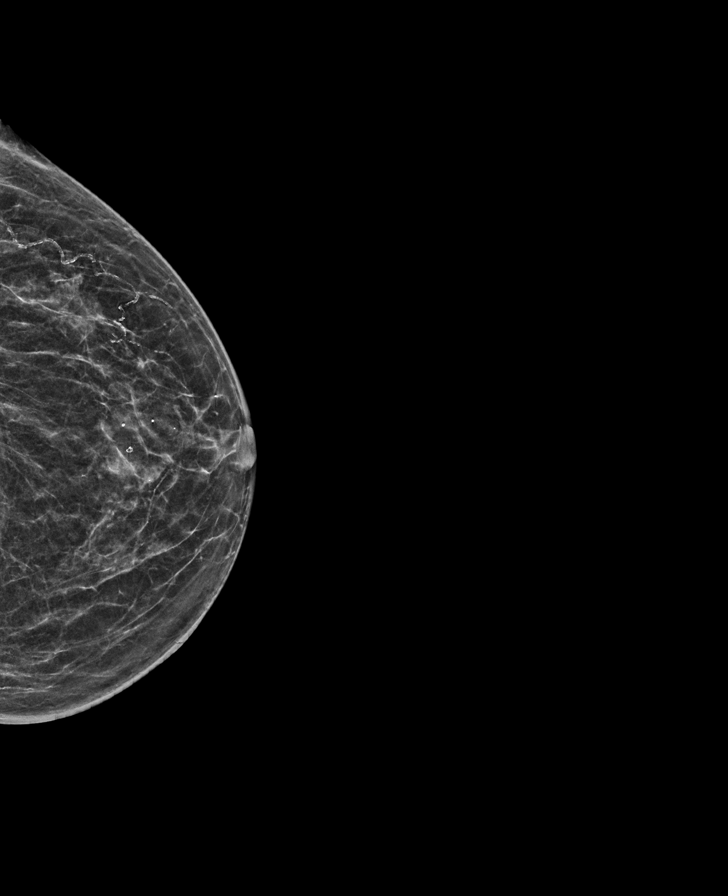

[R CC synth-2D (2 of 3)]
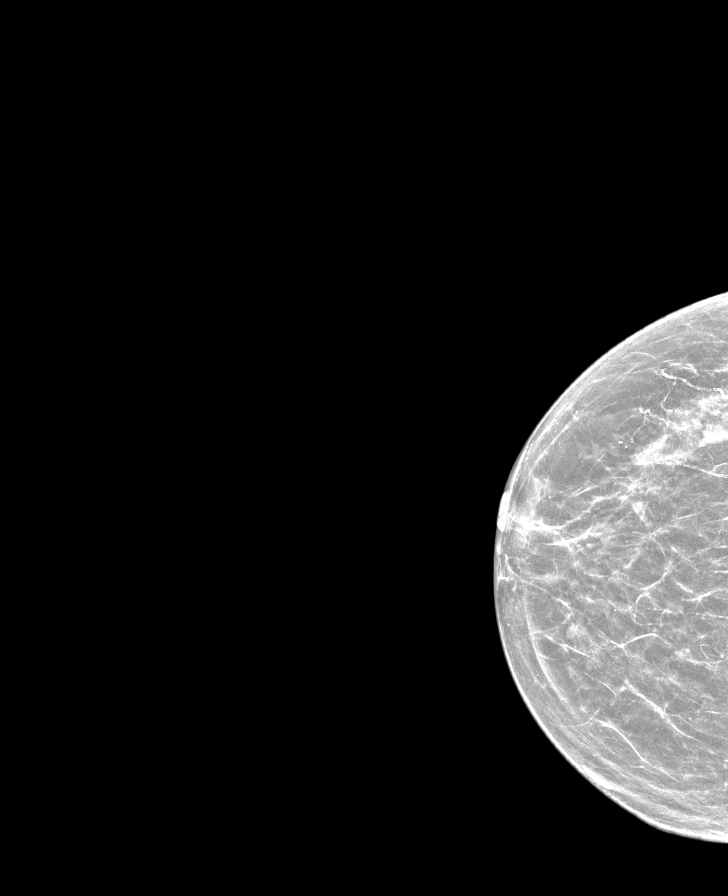

[L MLO synth-2D]
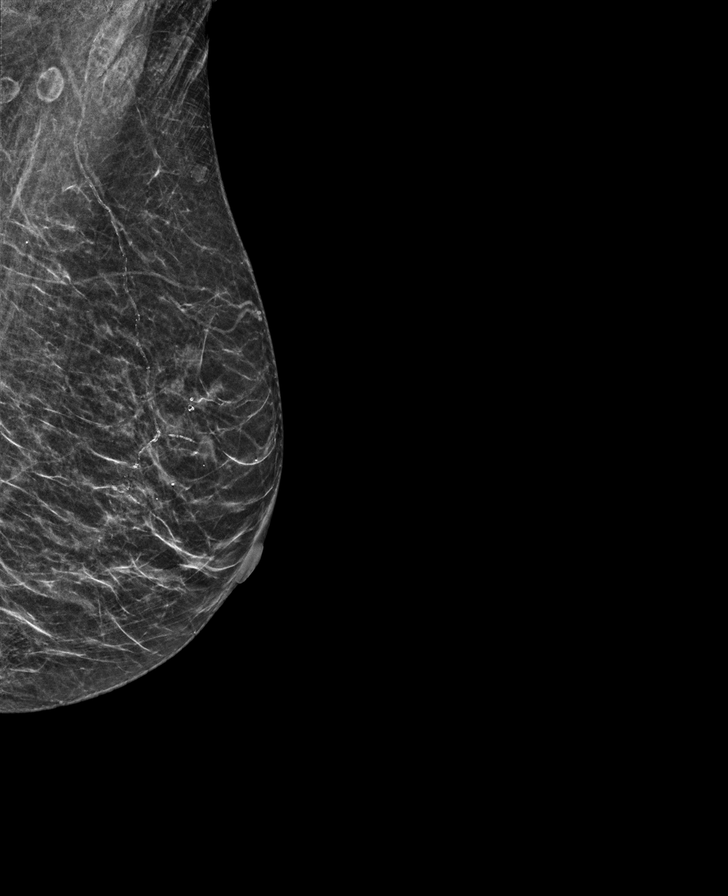

[L CC synth-2D (2 of 2)]
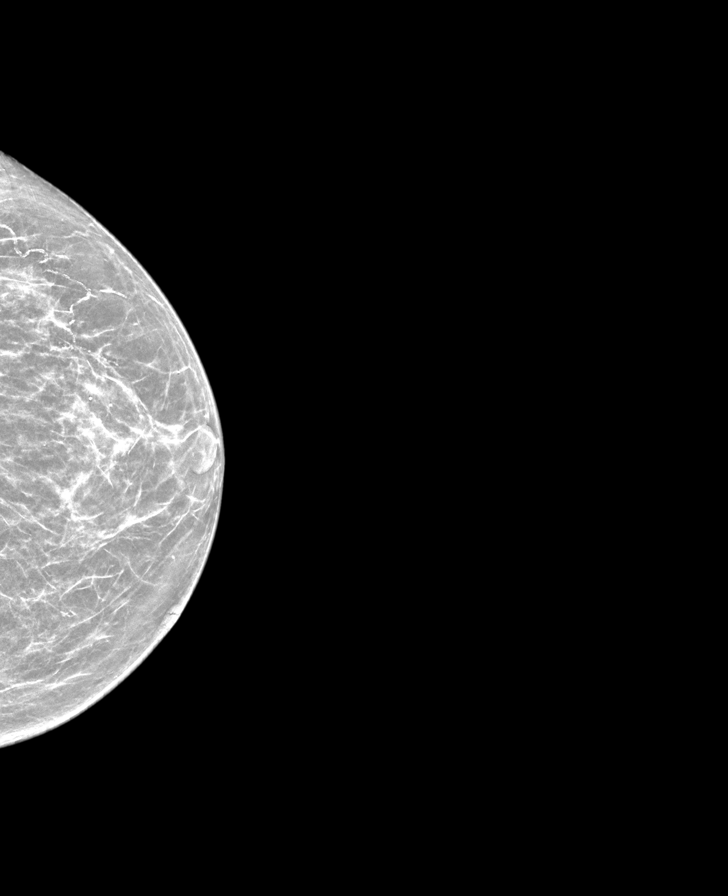

[R CC synth-2D (3 of 3)]
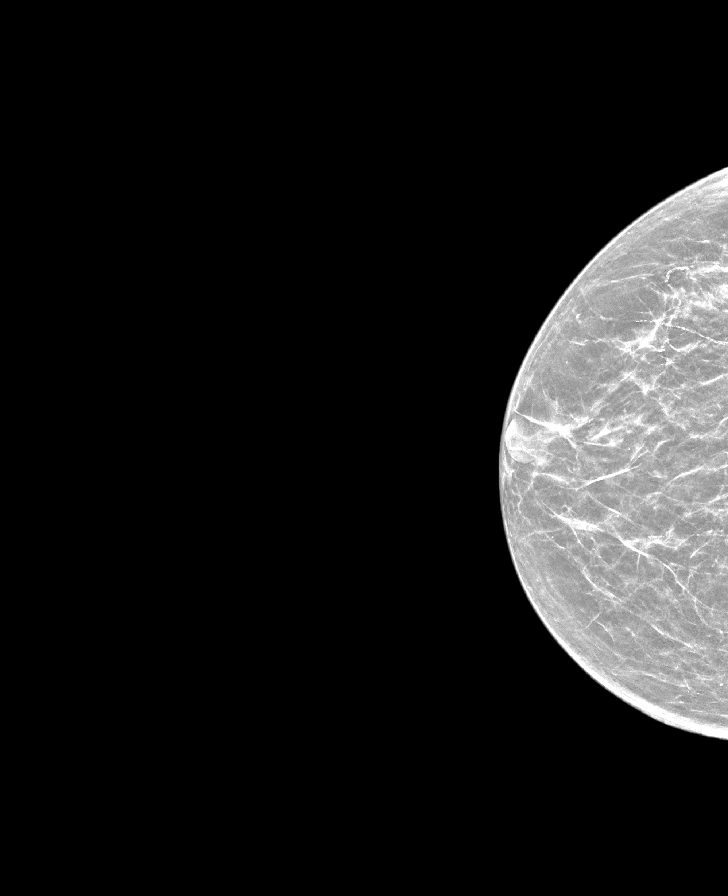

[R MLO synth-2D]
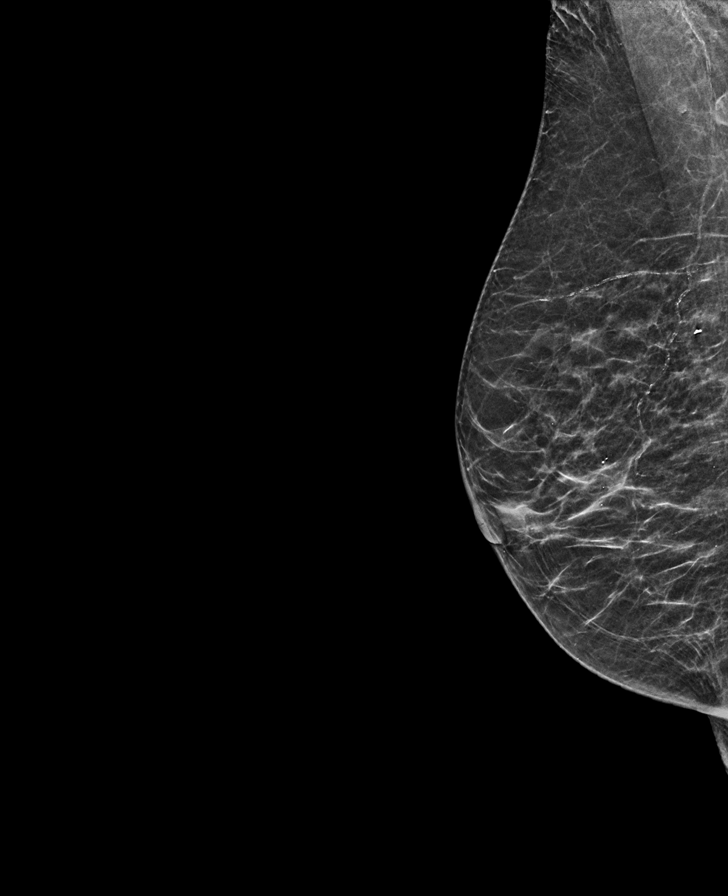

[L MLO tomo · tomo slice 22/43.0]
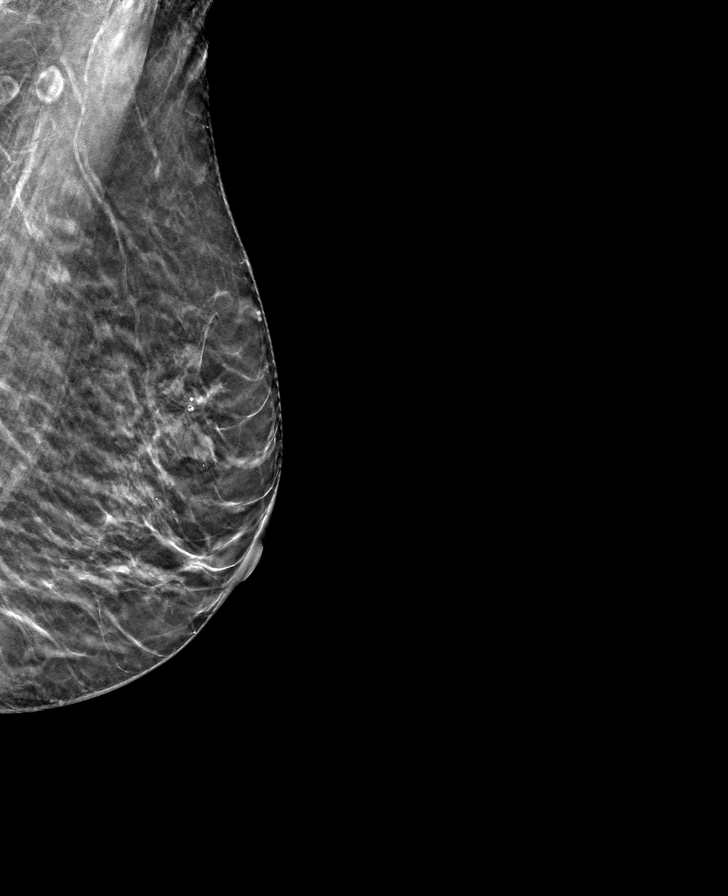

[8 of 40 positions shown; findings below may reference images not displayed]

ACR Breast Density Category b: There are scattered areas of
fibroglandular density.
FINDINGS: There are no findings suspicious for malignancy. Images were
processed with CAD.
IMPRESSION: No mammographic evidence of malignancy. A result letter of this
screening mammogram will be mailed directly to the patient.

RECOMMENDATION:
Screening mammogram in one year. (Code:[id number])

BI-RADS CATEGORY  1: Negative.

## 2020-04-03 ENCOUNTER — Other Ambulatory Visit: Payer: Self-pay

## 2020-04-03 ENCOUNTER — Encounter: Payer: Self-pay | Admitting: Ophthalmology

## 2020-04-05 ENCOUNTER — Other Ambulatory Visit
Admission: RE | Admit: 2020-04-05 | Discharge: 2020-04-05 | Disposition: A | Discharge status: Home / Self Care | Payer: Medicare HMO | Source: Ambulatory Visit | Attending: Ophthalmology | Admitting: Ophthalmology

## 2020-04-05 ENCOUNTER — Other Ambulatory Visit: Payer: Self-pay

## 2020-04-05 DIAGNOSIS — Z01812 Encounter for preprocedural laboratory examination: Secondary | ICD-10-CM | POA: Insufficient documentation

## 2020-04-05 DIAGNOSIS — Z20822 Contact with and (suspected) exposure to covid-19: Secondary | ICD-10-CM | POA: Insufficient documentation

## 2020-04-05 NOTE — Discharge Instructions (Signed)

## 2020-04-06 LAB — SARS CORONAVIRUS 2 (TAT 6-24 HRS): SARS Coronavirus 2: NEGATIVE

## 2020-04-09 ENCOUNTER — Ambulatory Visit: Payer: Medicare HMO | Admitting: Anesthesiology

## 2020-04-09 ENCOUNTER — Other Ambulatory Visit: Payer: Self-pay

## 2020-04-09 ENCOUNTER — Encounter
Admission: RE | Disposition: A | Discharge status: Home / Self Care | Payer: Self-pay | Source: Home / Self Care | Attending: Ophthalmology

## 2020-04-09 ENCOUNTER — Ambulatory Visit
Admission: RE | Admit: 2020-04-09 | Discharge: 2020-04-09 | Disposition: A | Discharge status: Home / Self Care | Payer: Medicare HMO | Attending: Ophthalmology | Admitting: Ophthalmology

## 2020-04-09 DIAGNOSIS — Z8249 Family history of ischemic heart disease and other diseases of the circulatory system: Secondary | ICD-10-CM | POA: Insufficient documentation

## 2020-04-09 DIAGNOSIS — Z888 Allergy status to other drugs, medicaments and biological substances status: Secondary | ICD-10-CM | POA: Diagnosis not present

## 2020-04-09 DIAGNOSIS — Z955 Presence of coronary angioplasty implant and graft: Secondary | ICD-10-CM | POA: Insufficient documentation

## 2020-04-09 DIAGNOSIS — Z79899 Other long term (current) drug therapy: Secondary | ICD-10-CM | POA: Insufficient documentation

## 2020-04-09 DIAGNOSIS — H2512 Age-related nuclear cataract, left eye: Secondary | ICD-10-CM | POA: Diagnosis not present

## 2020-04-09 DIAGNOSIS — E1136 Type 2 diabetes mellitus with diabetic cataract: Secondary | ICD-10-CM | POA: Insufficient documentation

## 2020-04-09 DIAGNOSIS — Z803 Family history of malignant neoplasm of breast: Secondary | ICD-10-CM | POA: Diagnosis not present

## 2020-04-09 DIAGNOSIS — Z880 Allergy status to penicillin: Secondary | ICD-10-CM | POA: Insufficient documentation

## 2020-04-09 DIAGNOSIS — Z7984 Long term (current) use of oral hypoglycemic drugs: Secondary | ICD-10-CM | POA: Diagnosis not present

## 2020-04-09 HISTORY — DX: Other specified postprocedural states: Z98.890

## 2020-04-09 HISTORY — DX: Gastro-esophageal reflux disease without esophagitis: K21.9

## 2020-04-09 HISTORY — DX: Nausea with vomiting, unspecified: R11.2

## 2020-04-09 HISTORY — PX: CATARACT EXTRACTION W/PHACO: SHX586

## 2020-04-09 SURGERY — PHACOEMULSIFICATION, CATARACT, WITH IOL INSERTION
Anesthesia: Monitor Anesthesia Care | Site: Eye | Laterality: Left

## 2020-04-09 MED ORDER — EPINEPHRINE PF 1 MG/ML IJ SOLN
INTRAOCULAR | Status: DC | PRN
  Administered 2020-04-09: 4.97 mL via OPHTHALMIC

## 2020-04-09 MED ORDER — ARMC OPHTHALMIC DILATING DROPS
1.0000 "application " | OPHTHALMIC | Status: DC | PRN
  Administered 2020-04-09 (×3): 1 via OPHTHALMIC

## 2020-04-09 MED ORDER — LIDOCAINE HCL (PF) 2 % IJ SOLN
INTRAOCULAR | Status: DC | PRN
  Administered 2020-04-09: 1 mL

## 2020-04-09 MED ORDER — TETRACAINE HCL 0.5 % OP SOLN
1.0000 [drp] | OPHTHALMIC | Status: DC | PRN
  Administered 2020-04-09 (×3): 1 [drp] via OPHTHALMIC

## 2020-04-09 MED ORDER — NA CHONDROIT SULF-NA HYALURON 40-17 MG/ML IO SOLN
INTRAOCULAR | Status: DC | PRN
  Administered 2020-04-09: 1 mL via INTRAOCULAR

## 2020-04-09 MED ORDER — MOXIFLOXACIN HCL 0.5 % OP SOLN
OPHTHALMIC | Status: DC | PRN
  Administered 2020-04-09: 0.2 mL via OPHTHALMIC

## 2020-04-09 MED ORDER — MIDAZOLAM HCL 2 MG/2ML IJ SOLN
INTRAMUSCULAR | Status: DC | PRN
  Administered 2020-04-09: 2 mg via INTRAVENOUS

## 2020-04-09 MED ORDER — BRIMONIDINE TARTRATE-TIMOLOL 0.2-0.5 % OP SOLN
OPHTHALMIC | Status: DC | PRN
  Administered 2020-04-09: 1 [drp] via OPHTHALMIC

## 2020-04-09 MED ORDER — FENTANYL CITRATE (PF) 100 MCG/2ML IJ SOLN
INTRAMUSCULAR | Status: DC | PRN
  Administered 2020-04-09: 50 ug via INTRAVENOUS

## 2020-04-09 SURGICAL SUPPLY — 19 items
CANNULA ANT/CHMB 27G (MISCELLANEOUS) ×2 IMPLANT
CANNULA ANT/CHMB 27GA (MISCELLANEOUS) ×4 IMPLANT
GLOVE SURG LX 8.0 MICRO (GLOVE) ×1
GLOVE SURG LX STRL 8.0 MICRO (GLOVE) ×1 IMPLANT
GLOVE SURG TRIUMPH 8.0 PF LTX (GLOVE) ×2 IMPLANT
GOWN STRL REUS W/ TWL LRG LVL3 (GOWN DISPOSABLE) ×2 IMPLANT
GOWN STRL REUS W/TWL LRG LVL3 (GOWN DISPOSABLE) ×4
LENS IOL TECNIS EYHANCE 22.0 (Intraocular Lens) ×1 IMPLANT
MARKER SKIN DUAL TIP RULER LAB (MISCELLANEOUS) ×2 IMPLANT
NDL FILTER BLUNT 18X1 1/2 (NEEDLE) ×1 IMPLANT
NEEDLE FILTER BLUNT 18X 1/2SAF (NEEDLE) ×1
NEEDLE FILTER BLUNT 18X1 1/2 (NEEDLE) ×1 IMPLANT
PACK EYE AFTER SURG (MISCELLANEOUS) ×2 IMPLANT
PACK OPTHALMIC (MISCELLANEOUS) ×2 IMPLANT
PACK PORFILIO (MISCELLANEOUS) ×2 IMPLANT
SYR 3ML LL SCALE MARK (SYRINGE) ×2 IMPLANT
SYR TB 1ML LUER SLIP (SYRINGE) ×2 IMPLANT
WATER STERILE IRR 250ML POUR (IV SOLUTION) ×2 IMPLANT
WIPE NON LINTING 3.25X3.25 (MISCELLANEOUS) ×2 IMPLANT

## 2020-04-09 NOTE — Anesthesia Postprocedure Evaluation (Signed)
Anesthesia Post Note  Patient: Roberta Leon  Procedure(s) Performed: CATARACT EXTRACTION PHACO AND INTRAOCULAR LENS PLACEMENT (IOC) LEFT 4.97 00:35.8 (Left Eye)     Patient location during evaluation: PACU Anesthesia Type: MAC Level of consciousness: awake and alert Pain management: pain level controlled Vital Signs Assessment: post-procedure vital signs reviewed and stable Respiratory status: spontaneous breathing Cardiovascular status: stable Anesthetic complications: no   No complications documented.  Gillian Scarce

## 2020-04-09 NOTE — Transfer of Care (Signed)
Immediate Anesthesia Transfer of Care Note  Patient: Roberta Leon  Procedure(s) Performed: CATARACT EXTRACTION PHACO AND INTRAOCULAR LENS PLACEMENT (IOC) LEFT (Left Eye)  Patient Location: PACU  Anesthesia Type: MAC  Level of Consciousness: awake, alert  and patient cooperative  Airway and Oxygen Therapy: Patient Spontanous Breathing and Patient connected to supplemental oxygen  Post-op Assessment: Post-op Vital signs reviewed, Patient's Cardiovascular Status Stable, Respiratory Function Stable, Patent Airway and No signs of Nausea or vomiting  Post-op Vital Signs: Reviewed and stable  Complications: No complications documented.

## 2020-04-09 NOTE — Op Note (Signed)
PREOPERATIVE DIAGNOSIS:  Nuclear sclerotic cataract of the left eye.   POSTOPERATIVE DIAGNOSIS:  Nuclear sclerotic cataract of the left eye.   OPERATIVE PROCEDURE:@   SURGEON:  Galen Manila, MD.   ANESTHESIA:  Anesthesiologist: Jolayne Panther, MD CRNA: Jinny Blossom, CRNA  1.      Managed anesthesia care. 2.     0.32ml of Shugarcaine was instilled following the paracentesis   COMPLICATIONS:  None.   TECHNIQUE:   Stop and chop   DESCRIPTION OF PROCEDURE:  The patient was examined and consented in the preoperative holding area where the aforementioned topical anesthesia was applied to the left eye and then brought back to the Operating Room where the left eye was prepped and draped in the usual sterile ophthalmic fashion and a lid speculum was placed. A paracentesis was created with the side port blade and the anterior chamber was filled with viscoelastic. A near clear corneal incision was performed with the steel keratome. A continuous curvilinear capsulorrhexis was performed with a cystotome followed by the capsulorrhexis forceps. Hydrodissection and hydrodelineation were carried out with BSS on a blunt cannula. The lens was removed in a stop and chop  technique and the remaining cortical material was removed with the irrigation-aspiration handpiece. The capsular bag was inflated with viscoelastic and the Technis [id number] lens was placed in the capsular bag without complication. The remaining viscoelastic was removed from the eye with the irrigation-aspiration handpiece. The wounds were hydrated. The anterior chamber was flushed with BSS and the eye was inflated to physiologic pressure. 0.33ml Vigamox was placed in the anterior chamber. The wounds were found to be water tight. The eye was dressed with Combigan. The patient was given protective glasses to wear throughout the day and a shield with which to sleep tonight. The patient was also given drops with which to begin a drop regimen  today and will follow-up with me in one day. Implant Name Type Inv. Item Serial No. Manufacturer Lot No. LRB No. Used Action  LENS IOL TECNIS EYHANCE 22.0 - [id number] Intraocular Lens LENS IOL TECNIS EYHANCE 22.0 [id number] JOHNSON   Left 1 Implanted    Procedure(s) with comments: CATARACT EXTRACTION PHACO AND INTRAOCULAR LENS PLACEMENT (IOC) LEFT 4.97 00:35.8 (Left) - Diabetic  Electronically signed: Galen Manila 04/09/2020 10:30 AM

## 2020-04-09 NOTE — Anesthesia Procedure Notes (Signed)
Procedure Name: MAC Date/Time: 04/09/2020 10:18 AM Performed by: Jeannene Patella, CRNA Pre-anesthesia Checklist: Patient identified, Emergency Drugs available, Suction available, Timeout performed and Patient being monitored Patient Re-evaluated:Patient Re-evaluated prior to induction Oxygen Delivery Method: Nasal cannula Placement Confirmation: positive ETCO2

## 2020-04-09 NOTE — H&P (Signed)
[facility]   Primary Care Physician:  Barbette Reichmann, MD Ophthalmologist: Dr. Druscilla Brownie  Pre-Procedure History & Physical: HPI:  Roberta Leon is a 87 y.o. female here for cataract surgery.   Past Medical History:  Diagnosis Date  . Diabetes mellitus without complication (HCC)   . GERD (gastroesophageal reflux disease)   . High cholesterol   . Hypertension   . PONV (postoperative nausea and vomiting)     Past Surgical History:  Procedure Laterality Date  . ABDOMINAL HYSTERECTOMY    . CORONARY STENT PLACEMENT    . ESOPHAGOGASTRODUODENOSCOPY N/A 06/10/2015   Procedure: ESOPHAGOGASTRODUODENOSCOPY (EGD);  Surgeon: Scot Jun, MD;  Location: [facility] ENDOSCOPY;  Service: Endoscopy;  Laterality: N/A;  . THYROIDECTOMY, PARTIAL    . TONSILLECTOMY      Prior to Admission medications   Medication Sig Start Date End Date Taking? Authorizing Provider  acetaminophen (ACETAMINOPHEN 8 HOUR) 650 MG CR tablet Take 650 mg by mouth every 8 (eight) hours as needed for pain.   Yes [provider]  amLODipine (NORVASC) 2.5 MG tablet Take 2.5 mg by mouth daily.   Yes [provider]  cholecalciferol (VITAMIN D) 1000 units tablet Take 1,000 Units by mouth daily.   Yes [provider]  Cinnamon 500 MG TABS Take 1,000 mg by mouth daily.   Yes [provider]  enalapril (VASOTEC) 10 MG tablet Take 10 mg by mouth daily.   Yes [provider]  esomeprazole (NEXIUM) 40 MG capsule Take 1 capsule (40 mg total) by mouth 2 (two) times daily before a meal. 06/11/15  Yes Auburn Bilberry, MD  gabapentin (NEURONTIN) 100 MG capsule Take 100 mg by mouth 3 (three) times daily.   Yes [provider]  hydrochlorothiazide (MICROZIDE) 12.5 MG capsule Take 12.5 mg by mouth daily.   Yes [provider]  metoprolol succinate (TOPROL-XL) 50 MG 24 hr tablet Take 50 mg by mouth daily. Take with or immediately following a meal.   Yes [provider]  simvastatin (ZOCOR) 80 MG tablet Take 40 mg by mouth daily.   Yes [provider]  vitamin B-12 (CYANOCOBALAMIN) 1000 MCG tablet Take 1,000 mcg by mouth daily.   Yes [provider]  metFORMIN (GLUCOPHAGE) 500 MG tablet Take 500 mg by mouth 2 (two) times daily with a meal. Patient not taking: Reported on 04/03/2020    [provider]    Allergies as of 02/20/2020 - Review Complete 06/10/2015  Allergen Reaction Noted  . Niacin and related Hives 06/09/2015  . Penicillins Rash 06/09/2015    Family History  Problem Relation Age of Onset  . Hypertension Other   . Breast cancer Sister     Social History   Socioeconomic History  . Marital status: Single    Spouse name: Not on file  . Number of children: Not on file  . Years of education: Not on file  . Highest education level: Not on file  Occupational History  . Not on file  Tobacco Use  . Smoking status: Never Smoker  . Smokeless tobacco: Never Used  Vaping Use  . Vaping Use: Never used  Substance and Sexual Activity  . Alcohol use: No  . Drug use: No  . Sexual activity: Never  Other Topics Concern  . Not on file  Social History Narrative  . Not on file   Social Determinants of Health   Financial Resource Strain: Not on file  Food Insecurity: Not on file  Transportation Needs: Not on file  Physical Activity: Not on file  Stress: Not on file  Social Connections: Not on file  Intimate Partner Violence: Not on file    Review of Systems: See HPI, otherwise negative ROS  Physical Exam: BP (S) (!) 201/88 Comment: repeated BP in left arm 219/89  Pulse 65   Temp (!) 96.5 F (35.8 C) (Temporal)   Resp 18   Ht 5\' 4"  (1.626 m)   Wt 61.4 kg   SpO2 100%   BMI 23.24 kg/m  General:   Alert,  pleasant and cooperative in NAD Head:  Normocephalic and atraumatic. Respiratory:  Normal work of breathing.  Impression/Plan: is here for cataract  surgery.  Risks, benefits, limitations, and alternatives regarding cataract surgery have been reviewed with the patient.  Questions have been answered.  All parties agreeable.   Dagoberto Ligas, MD  04/09/2020, 10:07 AM

## 2020-04-09 NOTE — Anesthesia Preprocedure Evaluation (Signed)
Anesthesia Evaluation  Patient identified by MRN, date of birth, ID band Patient awake    Reviewed: Allergy & Precautions, H&P , NPO status , Patient's Chart, lab work & pertinent test results  Airway Mallampati: II  TM Distance: >3 FB Neck ROM: full    Dental no notable dental hx.    Pulmonary neg pulmonary ROS,    Pulmonary exam normal        Cardiovascular hypertension, On Medications Normal cardiovascular exam+ Valvular Problems/Murmurs AS  Rhythm:regular Rate:Normal     Neuro/Psych negative neurological ROS  negative psych ROS   GI/Hepatic Neg liver ROS, Medicated,  Endo/Other  diabetes  Renal/GU negative Renal ROS  negative genitourinary   Musculoskeletal   Abdominal   Peds  Hematology negative hematology ROS (+)   Anesthesia Other Findings   Reproductive/Obstetrics                             Anesthesia Physical Anesthesia Plan  ASA: III  Anesthesia Plan: MAC   Post-op Pain Management:    Induction:   PONV Risk Score and Plan:   Airway Management Planned:   Additional Equipment:   Intra-op Plan:   Post-operative Plan:   Informed Consent: I have reviewed the patients History and Physical, chart, labs and discussed the procedure including the risks, benefits and alternatives for the proposed anesthesia with the patient or authorized representative who has indicated his/her understanding and acceptance.       Plan Discussed with:   Anesthesia Plan Comments:         Anesthesia Quick Evaluation

## 2020-04-09 NOTE — Progress Notes (Signed)
No risk at this time. 

## 2020-04-10 ENCOUNTER — Encounter: Payer: Self-pay | Admitting: Ophthalmology

## 2020-04-16 ENCOUNTER — Encounter: Payer: Self-pay | Admitting: Ophthalmology

## 2020-04-16 ENCOUNTER — Other Ambulatory Visit: Payer: Self-pay

## 2020-04-19 ENCOUNTER — Other Ambulatory Visit
Admission: RE | Admit: 2020-04-19 | Discharge: 2020-04-19 | Disposition: A | Discharge status: Home / Self Care | Payer: Medicare HMO | Source: Ambulatory Visit | Attending: Ophthalmology | Admitting: Ophthalmology

## 2020-04-19 ENCOUNTER — Other Ambulatory Visit: Payer: Self-pay

## 2020-04-19 DIAGNOSIS — Z01812 Encounter for preprocedural laboratory examination: Secondary | ICD-10-CM | POA: Diagnosis present

## 2020-04-19 DIAGNOSIS — Z20822 Contact with and (suspected) exposure to covid-19: Secondary | ICD-10-CM | POA: Diagnosis not present

## 2020-04-19 LAB — SARS CORONAVIRUS 2 (TAT 6-24 HRS): SARS Coronavirus 2: NEGATIVE

## 2020-04-19 NOTE — Discharge Instructions (Signed)

## 2020-04-23 ENCOUNTER — Encounter
Admission: RE | Disposition: A | Discharge status: Home / Self Care | Payer: Self-pay | Source: Home / Self Care | Attending: Ophthalmology

## 2020-04-23 ENCOUNTER — Ambulatory Visit: Payer: Medicare HMO | Admitting: Anesthesiology

## 2020-04-23 ENCOUNTER — Ambulatory Visit
Admission: RE | Admit: 2020-04-23 | Discharge: 2020-04-23 | Disposition: A | Discharge status: Home / Self Care | Payer: Medicare HMO | Attending: Ophthalmology | Admitting: Ophthalmology

## 2020-04-23 ENCOUNTER — Encounter: Payer: Self-pay | Admitting: Ophthalmology

## 2020-04-23 ENCOUNTER — Other Ambulatory Visit: Payer: Self-pay

## 2020-04-23 DIAGNOSIS — Z79899 Other long term (current) drug therapy: Secondary | ICD-10-CM | POA: Diagnosis not present

## 2020-04-23 DIAGNOSIS — Z955 Presence of coronary angioplasty implant and graft: Secondary | ICD-10-CM | POA: Insufficient documentation

## 2020-04-23 DIAGNOSIS — Z961 Presence of intraocular lens: Secondary | ICD-10-CM | POA: Diagnosis not present

## 2020-04-23 DIAGNOSIS — Z888 Allergy status to other drugs, medicaments and biological substances status: Secondary | ICD-10-CM | POA: Insufficient documentation

## 2020-04-23 DIAGNOSIS — H2511 Age-related nuclear cataract, right eye: Secondary | ICD-10-CM | POA: Diagnosis present

## 2020-04-23 DIAGNOSIS — Z803 Family history of malignant neoplasm of breast: Secondary | ICD-10-CM | POA: Diagnosis not present

## 2020-04-23 DIAGNOSIS — Z9842 Cataract extraction status, left eye: Secondary | ICD-10-CM | POA: Insufficient documentation

## 2020-04-23 DIAGNOSIS — Z8249 Family history of ischemic heart disease and other diseases of the circulatory system: Secondary | ICD-10-CM | POA: Diagnosis not present

## 2020-04-23 DIAGNOSIS — Z880 Allergy status to penicillin: Secondary | ICD-10-CM | POA: Diagnosis not present

## 2020-04-23 HISTORY — PX: CATARACT EXTRACTION W/PHACO: SHX586

## 2020-04-23 SURGERY — PHACOEMULSIFICATION, CATARACT, WITH IOL INSERTION
Anesthesia: Monitor Anesthesia Care | Site: Eye | Laterality: Right

## 2020-04-23 MED ORDER — FENTANYL CITRATE (PF) 100 MCG/2ML IJ SOLN
INTRAMUSCULAR | Status: DC | PRN
  Administered 2020-04-23: 50 ug via INTRAVENOUS

## 2020-04-23 MED ORDER — EPINEPHRINE PF 1 MG/ML IJ SOLN
INTRAOCULAR | Status: DC | PRN
  Administered 2020-04-23: 41 mL via OPHTHALMIC

## 2020-04-23 MED ORDER — TETRACAINE HCL 0.5 % OP SOLN
1.0000 [drp] | OPHTHALMIC | Status: DC | PRN
  Administered 2020-04-23 (×3): 1 [drp] via OPHTHALMIC

## 2020-04-23 MED ORDER — MOXIFLOXACIN HCL 0.5 % OP SOLN
OPHTHALMIC | Status: DC | PRN
  Administered 2020-04-23: 0.2 mL via OPHTHALMIC

## 2020-04-23 MED ORDER — BRIMONIDINE TARTRATE-TIMOLOL 0.2-0.5 % OP SOLN
OPHTHALMIC | Status: DC | PRN
  Administered 2020-04-23: 1 [drp] via OPHTHALMIC

## 2020-04-23 MED ORDER — LACTATED RINGERS IV SOLN: INTRAVENOUS | Status: DC

## 2020-04-23 MED ORDER — ARMC OPHTHALMIC DILATING DROPS
1.0000 "application " | OPHTHALMIC | Status: DC | PRN
  Administered 2020-04-23 (×3): 1 via OPHTHALMIC

## 2020-04-23 MED ORDER — NA CHONDROIT SULF-NA HYALURON 40-17 MG/ML IO SOLN
INTRAOCULAR | Status: DC | PRN
  Administered 2020-04-23: 1 mL via INTRAOCULAR

## 2020-04-23 MED ORDER — LABETALOL HCL 5 MG/ML IV SOLN
10.0000 mg | Freq: Once | INTRAVENOUS | Status: AC
  Administered 2020-04-23: 10 mg via INTRAVENOUS

## 2020-04-23 MED ORDER — MIDAZOLAM HCL 2 MG/2ML IJ SOLN
INTRAMUSCULAR | Status: DC | PRN
  Administered 2020-04-23: 1 mg via INTRAVENOUS

## 2020-04-23 MED ORDER — LIDOCAINE HCL (PF) 2 % IJ SOLN
INTRAOCULAR | Status: DC | PRN
  Administered 2020-04-23: 1 mL

## 2020-04-23 SURGICAL SUPPLY — 19 items
CANNULA ANT/CHMB 27G (MISCELLANEOUS) ×2 IMPLANT
CANNULA ANT/CHMB 27GA (MISCELLANEOUS) ×4 IMPLANT
GLOVE SURG LX 8.0 MICRO (GLOVE) ×1
GLOVE SURG LX STRL 8.0 MICRO (GLOVE) ×1 IMPLANT
GLOVE SURG TRIUMPH 8.0 PF LTX (GLOVE) ×2 IMPLANT
GOWN STRL REUS W/ TWL LRG LVL3 (GOWN DISPOSABLE) ×2 IMPLANT
GOWN STRL REUS W/TWL LRG LVL3 (GOWN DISPOSABLE) ×4
LENS IOL TECNIS EYHANCE 22.0 (Intraocular Lens) ×1 IMPLANT
MARKER SKIN DUAL TIP RULER LAB (MISCELLANEOUS) ×2 IMPLANT
NDL FILTER BLUNT 18X1 1/2 (NEEDLE) ×1 IMPLANT
NEEDLE FILTER BLUNT 18X 1/2SAF (NEEDLE) ×1
NEEDLE FILTER BLUNT 18X1 1/2 (NEEDLE) ×1 IMPLANT
PACK EYE AFTER SURG (MISCELLANEOUS) ×2 IMPLANT
PACK OPTHALMIC (MISCELLANEOUS) ×2 IMPLANT
PACK PORFILIO (MISCELLANEOUS) ×2 IMPLANT
SYR 3ML LL SCALE MARK (SYRINGE) ×2 IMPLANT
SYR TB 1ML LUER SLIP (SYRINGE) ×2 IMPLANT
WATER STERILE IRR 250ML POUR (IV SOLUTION) ×2 IMPLANT
WIPE NON LINTING 3.25X3.25 (MISCELLANEOUS) ×2 IMPLANT

## 2020-04-23 NOTE — Op Note (Signed)
PREOPERATIVE DIAGNOSIS:  Nuclear sclerotic cataract of the right eye.   POSTOPERATIVE DIAGNOSIS:  H25.11 Cataract   OPERATIVE PROCEDURE:@   SURGEON:  Galen Manila, MD.   ANESTHESIA:  Anesthesiologist: Fletcher Anon, MD CRNA: Maree Krabbe, CRNA  1.      Managed anesthesia care. 2.      0.31ml of Shugarcaine was instilled in the eye following the paracentesis.   COMPLICATIONS:  None.   TECHNIQUE:   Stop and chop   DESCRIPTION OF PROCEDURE:  The patient was examined and consented in the preoperative holding area where the aforementioned topical anesthesia was applied to the right eye and then brought back to the Operating Room where the right eye was prepped and draped in the usual sterile ophthalmic fashion and a lid speculum was placed. A paracentesis was created with the side port blade and the anterior chamber was filled with viscoelastic. A near clear corneal incision was performed with the steel keratome. A continuous curvilinear capsulorrhexis was performed with a cystotome followed by the capsulorrhexis forceps. Hydrodissection and hydrodelineation were carried out with BSS on a blunt cannula. The lens was removed in a stop and chop  technique and the remaining cortical material was removed with the irrigation-aspiration handpiece. The capsular bag was inflated with viscoelastic and the Technis [id number]  lens was placed in the capsular bag without complication. The remaining viscoelastic was removed from the eye with the irrigation-aspiration handpiece. The wounds were hydrated. The anterior chamber was flushed with BSS and the eye was inflated to physiologic pressure. 0.44ml of Vigamox was placed in the anterior chamber. The wounds were found to be water tight. The eye was dressed with Combigan. The patient was given protective glasses to wear throughout the day and a shield with which to sleep tonight. The patient was also given drops with which to begin a drop regimen today and will  follow-up with me in one day. Implant Name Type Inv. Item Serial No. Manufacturer Lot No. LRB No. Used Action  LENS IOL TECNIS EYHANCE 22.0 - [id number] Intraocular Lens LENS IOL TECNIS EYHANCE 22.0 [id number] JOHNSON   Right 1 Implanted   Procedure(s) with comments: CATARACT EXTRACTION PHACO AND INTRAOCULAR LENS PLACEMENT (IOC) RIGHT 5.27 00:37.5 (Right) - Diabetic - diet controlled  Electronically signed: Galen Manila 04/23/2020 11:56 AM

## 2020-04-23 NOTE — Transfer of Care (Signed)
Immediate Anesthesia Transfer of Care Note  Patient: Roberta Leon  Procedure(s) Performed: CATARACT EXTRACTION PHACO AND INTRAOCULAR LENS PLACEMENT (IOC) RIGHT 5.27 00:37.5 (Right Eye)  Patient Location: PACU  Anesthesia Type: MAC  Level of Consciousness: awake, alert  and patient cooperative  Airway and Oxygen Therapy: Patient Spontanous Breathing and Patient connected to supplemental oxygen  Post-op Assessment: Post-op Vital signs reviewed, Patient's Cardiovascular Status Stable, Respiratory Function Stable, Patent Airway and No signs of Nausea or vomiting  Post-op Vital Signs: Reviewed and stable  Complications: No complications documented.

## 2020-04-23 NOTE — Anesthesia Postprocedure Evaluation (Signed)
Anesthesia Post Note  Patient: Roberta Leon  Procedure(s) Performed: CATARACT EXTRACTION PHACO AND INTRAOCULAR LENS PLACEMENT (IOC) RIGHT 5.27 00:37.5 (Right Eye)     Patient location during evaluation: PACU Anesthesia Type: MAC Level of consciousness: awake and alert Pain management: pain level controlled Vital Signs Assessment: post-procedure vital signs reviewed and stable Respiratory status: nonlabored ventilation and spontaneous breathing Cardiovascular status: blood pressure returned to baseline Postop Assessment: no apparent nausea or vomiting Anesthetic complications: no   No complications documented.  Arby Dahir [employer]

## 2020-04-23 NOTE — Anesthesia Procedure Notes (Signed)
Procedure Name: MAC Date/Time: 04/23/2020 11:40 AM Performed by: Cameron Ali, CRNA Pre-anesthesia Checklist: Patient identified, Emergency Drugs available, Suction available, Timeout performed and Patient being monitored Patient Re-evaluated:Patient Re-evaluated prior to induction Oxygen Delivery Method: Nasal cannula Placement Confirmation: positive ETCO2

## 2020-04-23 NOTE — Anesthesia Preprocedure Evaluation (Addendum)
Anesthesia Evaluation  Patient identified by MRN, date of birth, ID band Patient awake    Reviewed: Allergy & Precautions, H&P , NPO status , Patient's Chart, lab work & pertinent test results  History of Anesthesia Complications (+) PONV and history of anesthetic complications  Airway Mallampati: II  TM Distance: >3 FB Neck ROM: full    Dental no notable dental hx.    Pulmonary neg pulmonary ROS,    Pulmonary exam normal        Cardiovascular hypertension, On Medications Normal cardiovascular exam+ Valvular Problems/Murmurs AS  Rhythm:regular Rate:Normal  ECHO 07/2019 NORMAL LEFT VENTRICULAR SYSTOLIC FUNCTION  NORMAL RIGHT VENTRICULAR SYSTOLIC FUNCTION  MILD MITRAL VALVE REGURGITATION  MODERATE AORTIC VALVE STENOSIS    Neuro/Psych negative neurological ROS  negative psych ROS   GI/Hepatic Neg liver ROS, GERD  Controlled,  Endo/Other  diabetes, Type 2  Renal/GU negative Renal ROS  negative genitourinary   Musculoskeletal   Abdominal Normal abdominal exam  (+)   Peds  Hematology negative hematology ROS (+)   Anesthesia Other Findings   Reproductive/Obstetrics                            Anesthesia Physical  Anesthesia Plan  ASA: III  Anesthesia Plan: MAC   Post-op Pain Management:    Induction: Intravenous  PONV Risk Score and Plan: 3 and TIVA, Midazolam and Treatment may vary due to age or medical condition  Airway Management Planned: Natural Airway  Additional Equipment: None  Intra-op Plan:   Post-operative Plan:   Informed Consent: I have reviewed the patients History and Physical, chart, labs and discussed the procedure including the risks, benefits and alternatives for the proposed anesthesia with the patient or authorized representative who has indicated his/her understanding and acceptance.     Dental advisory given  Plan Discussed with: CRNA  Anesthesia Plan  Comments: (SBP in 180's in preop. Patient took all of her antihypertensive medications this morning. She denies chest pain or other symptoms. Will administer labetalol 47m IV in preop.)       Anesthesia Quick Evaluation

## 2020-04-23 NOTE — H&P (Signed)
[facility]   Primary Care Physician:  Barbette Reichmann, MD Ophthalmologist: Dr. Druscilla Brownie  Pre-Procedure History & Physical: HPI:  Roberta Leon is a 87 y.o. female here for cataract surgery.   Past Medical History:  Diagnosis Date  . Diabetes mellitus without complication (HCC)   . GERD (gastroesophageal reflux disease)   . High cholesterol   . Hypertension   . PONV (postoperative nausea and vomiting)     Past Surgical History:  Procedure Laterality Date  . ABDOMINAL HYSTERECTOMY    . CATARACT EXTRACTION W/PHACO Left 04/09/2020   Procedure: CATARACT EXTRACTION PHACO AND INTRAOCULAR LENS PLACEMENT (IOC) LEFT 4.97 00:35.8;  Surgeon: Galen Manila, MD;  Location: [facility];  Service: Ophthalmology;  Laterality: Left;  Diabetic  . CORONARY STENT PLACEMENT    . ESOPHAGOGASTRODUODENOSCOPY N/A 06/10/2015   Procedure: ESOPHAGOGASTRODUODENOSCOPY (EGD);  Surgeon: Scot Jun, MD;  Location: [facility];  Service: Endoscopy;  Laterality: N/A;  . THYROIDECTOMY, PARTIAL    . TONSILLECTOMY      Prior to Admission medications   Medication Sig Start Date End Date Taking? Authorizing Provider  acetaminophen (TYLENOL) 650 MG CR tablet Take 650 mg by mouth every 8 (eight) hours as needed for pain.   Yes [provider]  amLODipine (NORVASC) 2.5 MG tablet Take 2.5 mg by mouth daily.   Yes [provider]  cholecalciferol (VITAMIN D) 1000 units tablet Take 1,000 Units by mouth daily.   Yes [provider]  Cinnamon 500 MG TABS Take 1,000 mg by mouth daily.   Yes [provider]  enalapril (VASOTEC) 10 MG tablet Take 10 mg by mouth daily.   Yes [provider]  esomeprazole (NEXIUM) 40 MG capsule Take 1 capsule (40 mg total) by mouth 2 (two) times daily before a meal. 06/11/15  Yes Auburn Bilberry, MD  gabapentin (NEURONTIN) 100 MG capsule Take 100 mg by mouth 3 (three) times daily.   Yes [provider]   hydrochlorothiazide (MICROZIDE) 12.5 MG capsule Take 12.5 mg by mouth daily.   Yes [provider]  metoprolol succinate (TOPROL-XL) 50 MG 24 hr tablet Take 50 mg by mouth daily. Take with or immediately following a meal.   Yes [provider]  simvastatin (ZOCOR) 80 MG tablet Take 40 mg by mouth daily.   Yes [provider]  vitamin B-12 (CYANOCOBALAMIN) 1000 MCG tablet Take 1,000 mcg by mouth daily.   Yes [provider]  metFORMIN (GLUCOPHAGE) 500 MG tablet Take 500 mg by mouth 2 (two) times daily with a meal. Patient not taking: Reported on 04/23/2020    [provider]    Allergies as of 02/20/2020 - Review Complete 06/10/2015  Allergen Reaction Noted  . Niacin and related Hives 06/09/2015  . Penicillins Rash 06/09/2015    Family History  Problem Relation Age of Onset  . Hypertension Other   . Breast cancer Sister     Social History   Socioeconomic History  . Marital status: Single    Spouse name: Not on file  . Number of children: Not on file  . Years of education: Not on file  . Highest education level: Not on file  Occupational History  . Not on file  Tobacco Use  . Smoking status: Never Smoker  . Smokeless tobacco: Never Used  Vaping Use  . Vaping Use: Never used  Substance and Sexual Activity  . Alcohol use: No  . Drug use: No  . Sexual activity: Never  Other  Topics Concern  . Not on file  Social History Narrative  . Not on file   Social Determinants of Health   Financial Resource Strain: Not on file  Food Insecurity: Not on file  Transportation Needs: Not on file  Physical Activity: Not on file  Stress: Not on file  Social Connections: Not on file  Intimate Partner Violence: Not on file    Review of Systems: See HPI, otherwise negative ROS  Physical Exam: BP (!) 151/70   Pulse 69   Temp (!) 97 F (36.1 C) (Temporal)   Ht 5\' 4"  (1.626 m)   Wt 60.8 kg   SpO2 99%   BMI 23.00 kg/m  General:    Alert,  pleasant and cooperative in NAD Head:  Normocephalic and atraumatic. Respiratory:  Normal work of breathing.  Impression/Plan: is here for cataract surgery.  Risks, benefits, limitations, and alternatives regarding cataract surgery have been reviewed with the patient.  Questions have been answered.  All parties agreeable.   Dagoberto Ligas, MD  04/23/2020, 11:29 AM

## 2020-04-24 ENCOUNTER — Encounter: Payer: Self-pay | Admitting: Ophthalmology

## 2020-05-07 ENCOUNTER — Emergency Department: Payer: Medicare HMO

## 2020-05-07 ENCOUNTER — Emergency Department
Admission: EM | Admit: 2020-05-07 | Discharge: 2020-05-07 | Disposition: A | Discharge status: Home / Self Care | Payer: Medicare HMO | Attending: Emergency Medicine | Admitting: Emergency Medicine

## 2020-05-07 ENCOUNTER — Other Ambulatory Visit: Payer: Self-pay

## 2020-05-07 DIAGNOSIS — I10 Essential (primary) hypertension: Secondary | ICD-10-CM | POA: Diagnosis not present

## 2020-05-07 DIAGNOSIS — E119 Type 2 diabetes mellitus without complications: Secondary | ICD-10-CM | POA: Insufficient documentation

## 2020-05-07 DIAGNOSIS — Z7984 Long term (current) use of oral hypoglycemic drugs: Secondary | ICD-10-CM | POA: Insufficient documentation

## 2020-05-07 DIAGNOSIS — Z955 Presence of coronary angioplasty implant and graft: Secondary | ICD-10-CM | POA: Insufficient documentation

## 2020-05-07 DIAGNOSIS — K449 Diaphragmatic hernia without obstruction or gangrene: Secondary | ICD-10-CM | POA: Diagnosis not present

## 2020-05-07 DIAGNOSIS — Z79899 Other long term (current) drug therapy: Secondary | ICD-10-CM | POA: Diagnosis not present

## 2020-05-07 DIAGNOSIS — K209 Esophagitis, unspecified without bleeding: Secondary | ICD-10-CM

## 2020-05-07 DIAGNOSIS — K2100 Gastro-esophageal reflux disease with esophagitis, without bleeding: Secondary | ICD-10-CM | POA: Diagnosis not present

## 2020-05-07 DIAGNOSIS — R072 Precordial pain: Secondary | ICD-10-CM | POA: Diagnosis present

## 2020-05-07 DIAGNOSIS — R079 Chest pain, unspecified: Secondary | ICD-10-CM

## 2020-05-07 LAB — BASIC METABOLIC PANEL
Anion gap: 12 (ref 5–15)
BUN: 14 mg/dL (ref 8–23)
CO2: 24 mmol/L (ref 22–32)
Calcium: 10.7 mg/dL — ABNORMAL HIGH (ref 8.9–10.3)
Chloride: 102 mmol/L (ref 98–111)
Creatinine, Ser: 0.59 mg/dL (ref 0.44–1.00)
GFR, Estimated: >60 mL/min (ref >60)
Glucose, Bld: 142 mg/dL — ABNORMAL HIGH (ref 70–99)
Potassium: 3.6 mmol/L (ref 3.5–5.1)
Sodium: 138 mmol/L (ref 135–145)

## 2020-05-07 LAB — CBC
HCT: 41.2 % (ref 36.0–46.0)
Hemoglobin: 14.1 g/dL (ref 12.0–15.0)
MCH: 29.5 pg (ref 26.0–34.0)
MCHC: 34.2 g/dL (ref 30.0–36.0)
MCV: 86.2 fL (ref 80.0–100.0)
Platelets: 186 10*3/uL (ref 150–400)
RBC: 4.78 MIL/uL (ref 3.87–5.11)
RDW: 13.4 % (ref 11.5–15.5)
WBC: 7.4 10*3/uL (ref 4.0–10.5)
nRBC: 0 % (ref 0.0–0.2)

## 2020-05-07 LAB — TROPONIN I (HIGH SENSITIVITY): Troponin I (High Sensitivity): 3 ng/L (ref <18)

## 2020-05-07 IMAGING — CT CT CHEST W/ CM
2 of 3 series · 15 of 36 positions shown, 18 images · IV contrast (omnipaque)
Comparison: Radiograph earlier today.

CLINICAL DATA: Large hiatal hernia with persistent nausea and
vomiting after eating. Chest pain.

EXAM:
CT CHEST WITH CONTRAST
TECHNIQUE: Multidetector CT imaging of the chest was performed during
intravenous contrast administration.
CONTRAST:  75mL OMNIPAQUE IOHEXOL 300 MG/ML  SOLN

[Series 2: axial st · axial · 0.59mm/px · z∈[-332,-78]mm · 12 of 151 slices shown, 15 images]
[im 12/151  mediastinal]
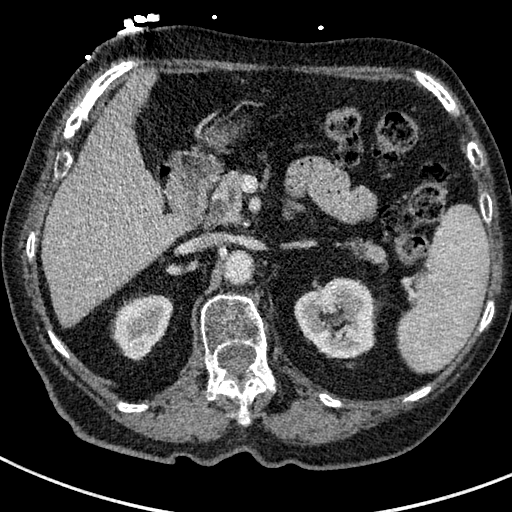
[im 12/151  lung]
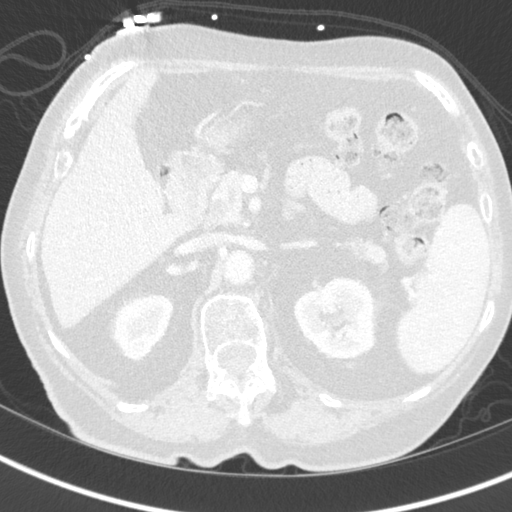
[im 23/151  lung]
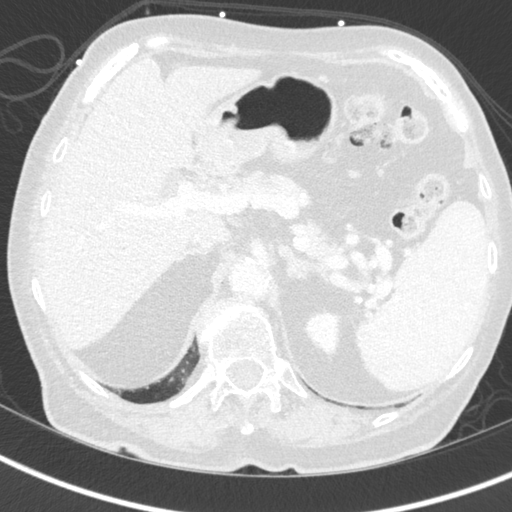
[im 34/151  lung]
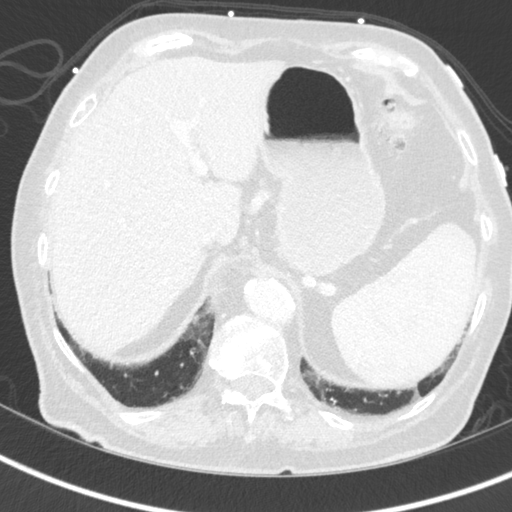
[im 45/151  lung]
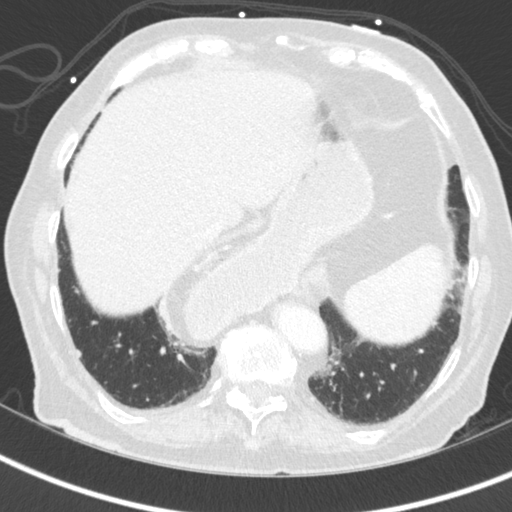
[im 56/151  mediastinal]
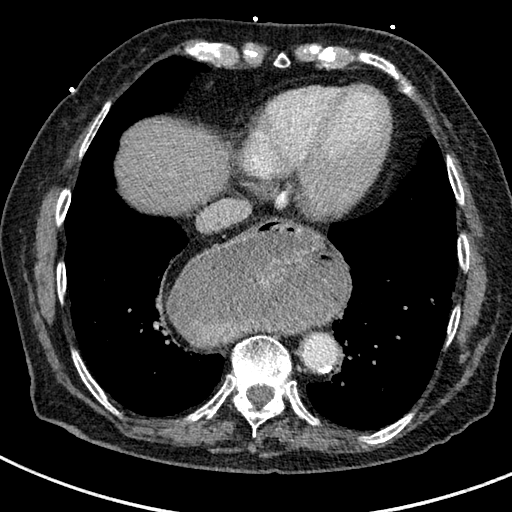
[im 56/151  lung]
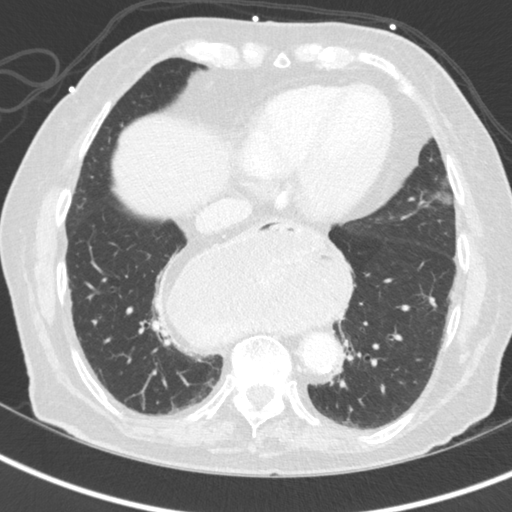
[im 67/151  lung]
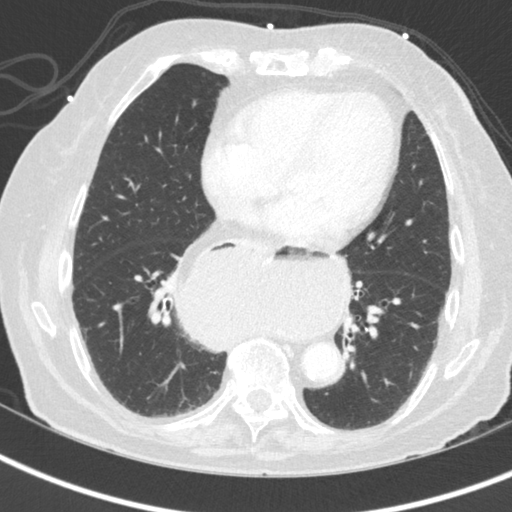
[im 84/151  lung]
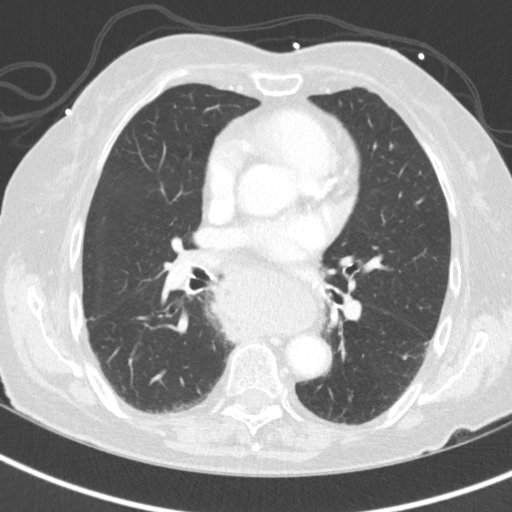
[im 95/151  lung]
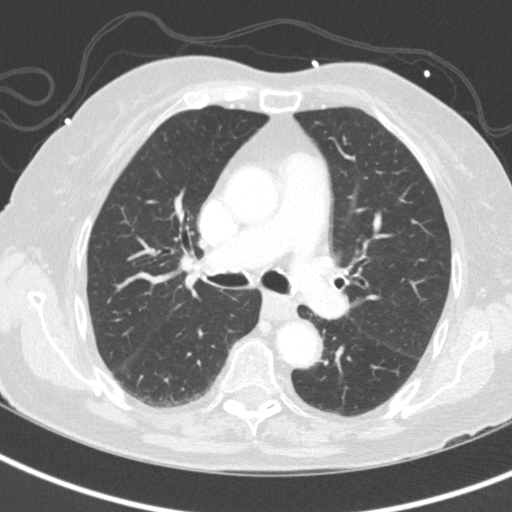
[im 106/151  mediastinal]
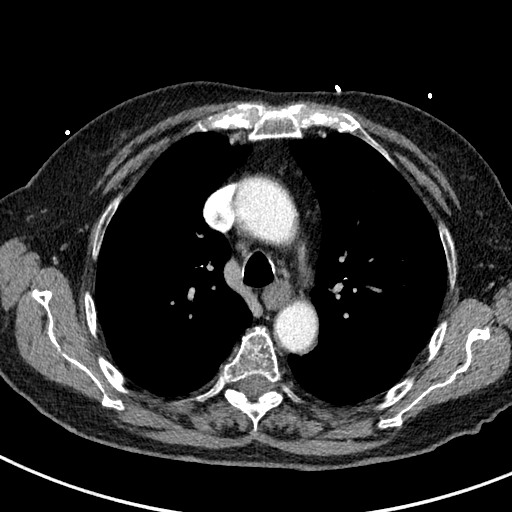
[im 106/151  lung]
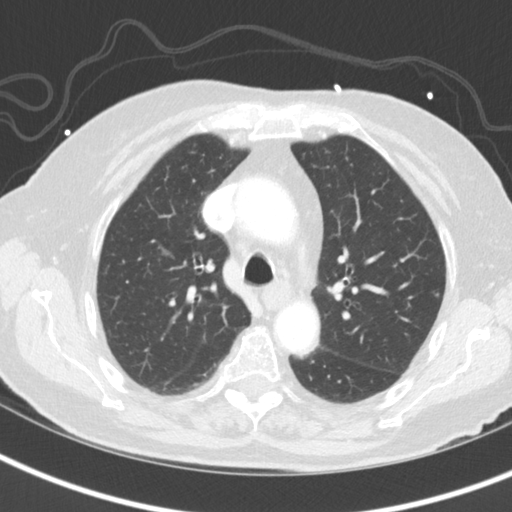
[im 117/151  lung]
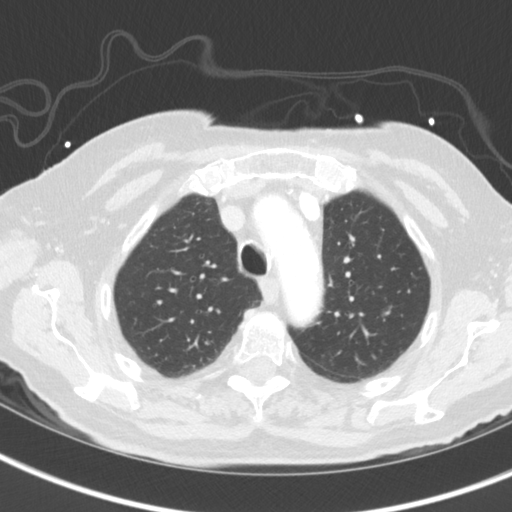
[im 128/151  lung]
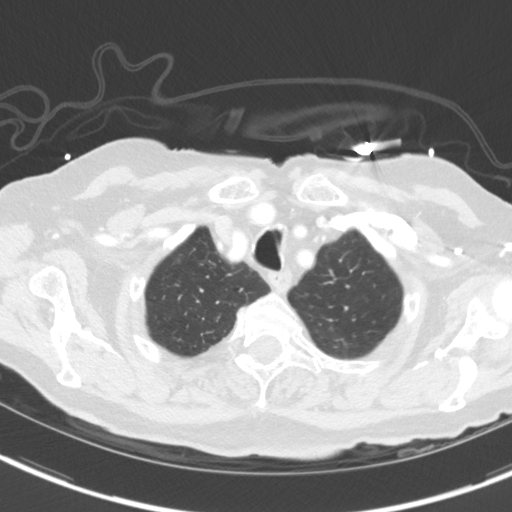
[im 139/151  lung]
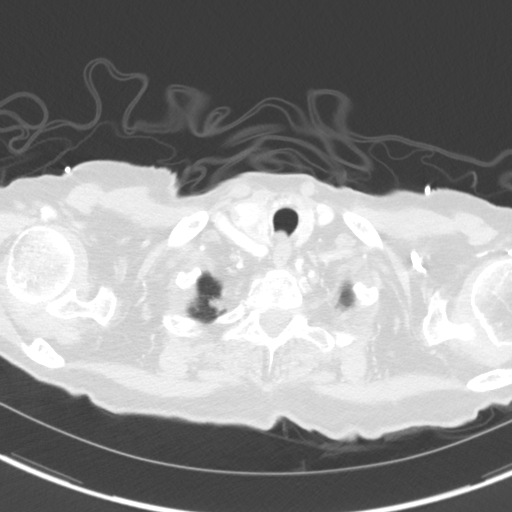

[Series 5: coronal · coronal · 0.64mm/px · 3 of 139 slices shown]
[im 28/139  lung]
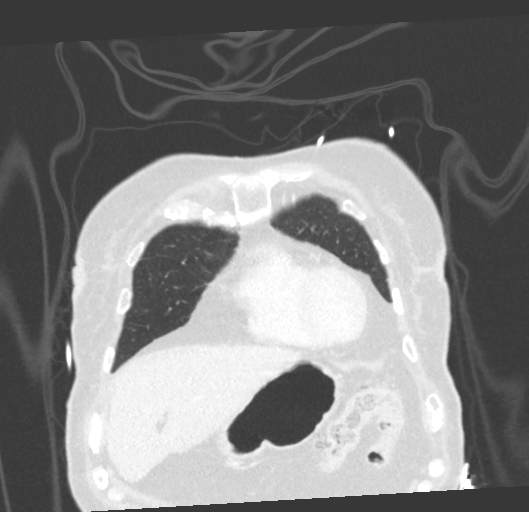
[im 56/139  lung]
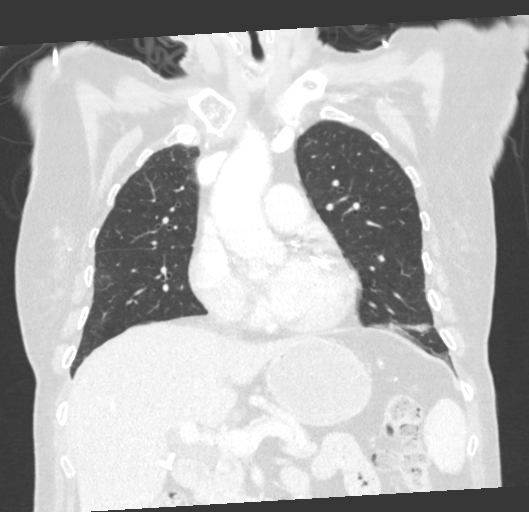
[im 83/139  lung]
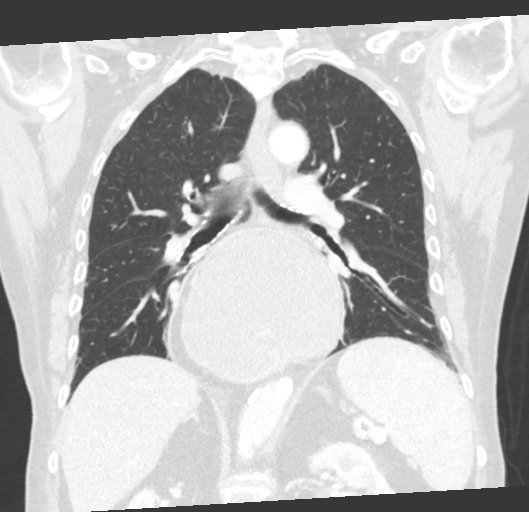

[15 of 36 positions shown; findings below may reference images not displayed]

FINDINGS: Cardiovascular: Mild aortic atherosclerosis and tortuosity. Moderate
coronary artery calcifications. Heart is normal in size. No
pericardial effusion.

Mediastinum/Nodes: There is a large hiatal hernia. Approximately 50%
of the stomach intrathoracic. There is fluid distending both the
intrathoracic as well as intra-abdominal portions of the stomach. No
gastric wall thickening or pneumatosis. There is mild wall
thickening of the distal esophagus with intraluminal fluid. No
enlarged mediastinal or hilar lymph nodes. No axillary adenopathy.
The right lobe of the thyroid gland is diffusely heterogeneous and
enlarged, with scattered calcifications nodules.

Lungs/Pleura: Compressive atelectasis in both lower lobes related to
hiatal hernia. Subsegmental atelectasis or scarring in the lingula.
Scattered small pulmonary nodules in both lungs, majority of these
are a 2-3 mm. This includes left apical nodule, series 3, image 22,
left upper lobe series 3, image 46, superior segment left lower lobe
series 3, image 53. There is a 4 mm pleural based nodule in the
right lower lobe, series 3, image 107. Small clustered tree-in-bud
nodularity in the dependent left lower lobe, series 3, image 111.
Trachea and central bronchi are patent.

Upper Abdomen: No small bowel dilatation in the included upper
abdomen. Cholecystectomy with dilatation of the common bile duct,
likely sequela of cholecystectomy. Calcified hepatic granuloma.

Musculoskeletal: Remote lower sternal body fracture. Remote right
posterior lower rib fractures. No acute osseous abnormalities are
seen. No evidence of focal bone lesion.
IMPRESSION: 1. Large hiatal hernia with fluid distending both the intrathoracic
as well as intra-abdominal portions of the stomach. No evidence of
gastric outlet obstruction or gastric inflammation.
2. Mild wall thickening of the distal esophagus with intraluminal
fluid, favors reflux.
3. Scattered small pulmonary nodules in both lungs, majority of
these are 2-3 mm. There is a 4 mm subpleural right lower lobe
nodule. No follow-up needed if patient is low-risk (and has no known
or suspected primary neoplasm). Non-contrast chest CT can be
considered in 12 months if patient is high-risk. This recommendation
follows the consensus statement: Guidelines for Management of
Incidental Pulmonary Nodules Detected on CT Images: From the
4. Small clustered tree-in-bud nodularity in the dependent left
lower lobe may be infectious or inflammatory.
5. Enlarged heterogeneous right lobe of the thyroid gland with
scattered calcifications. This is most consistent with goiter. In
the setting of significant comorbidities or limited life expectancy,
no follow-up recommended. (ref: [HOSPITAL]. [DATE]):
6. Aortic atherosclerosis and advanced coronary artery
calcifications.

Aortic Atherosclerosis ([id number]).

## 2020-05-07 IMAGING — DX DG CHEST 1V PORT
1 series · 1 of 1 positions shown · non-contrast
Comparison: 06/09/2015

CLINICAL DATA: Chest pain and vomiting for 2 hours

EXAM:
PORTABLE CHEST 1 VIEW

[chest ap]
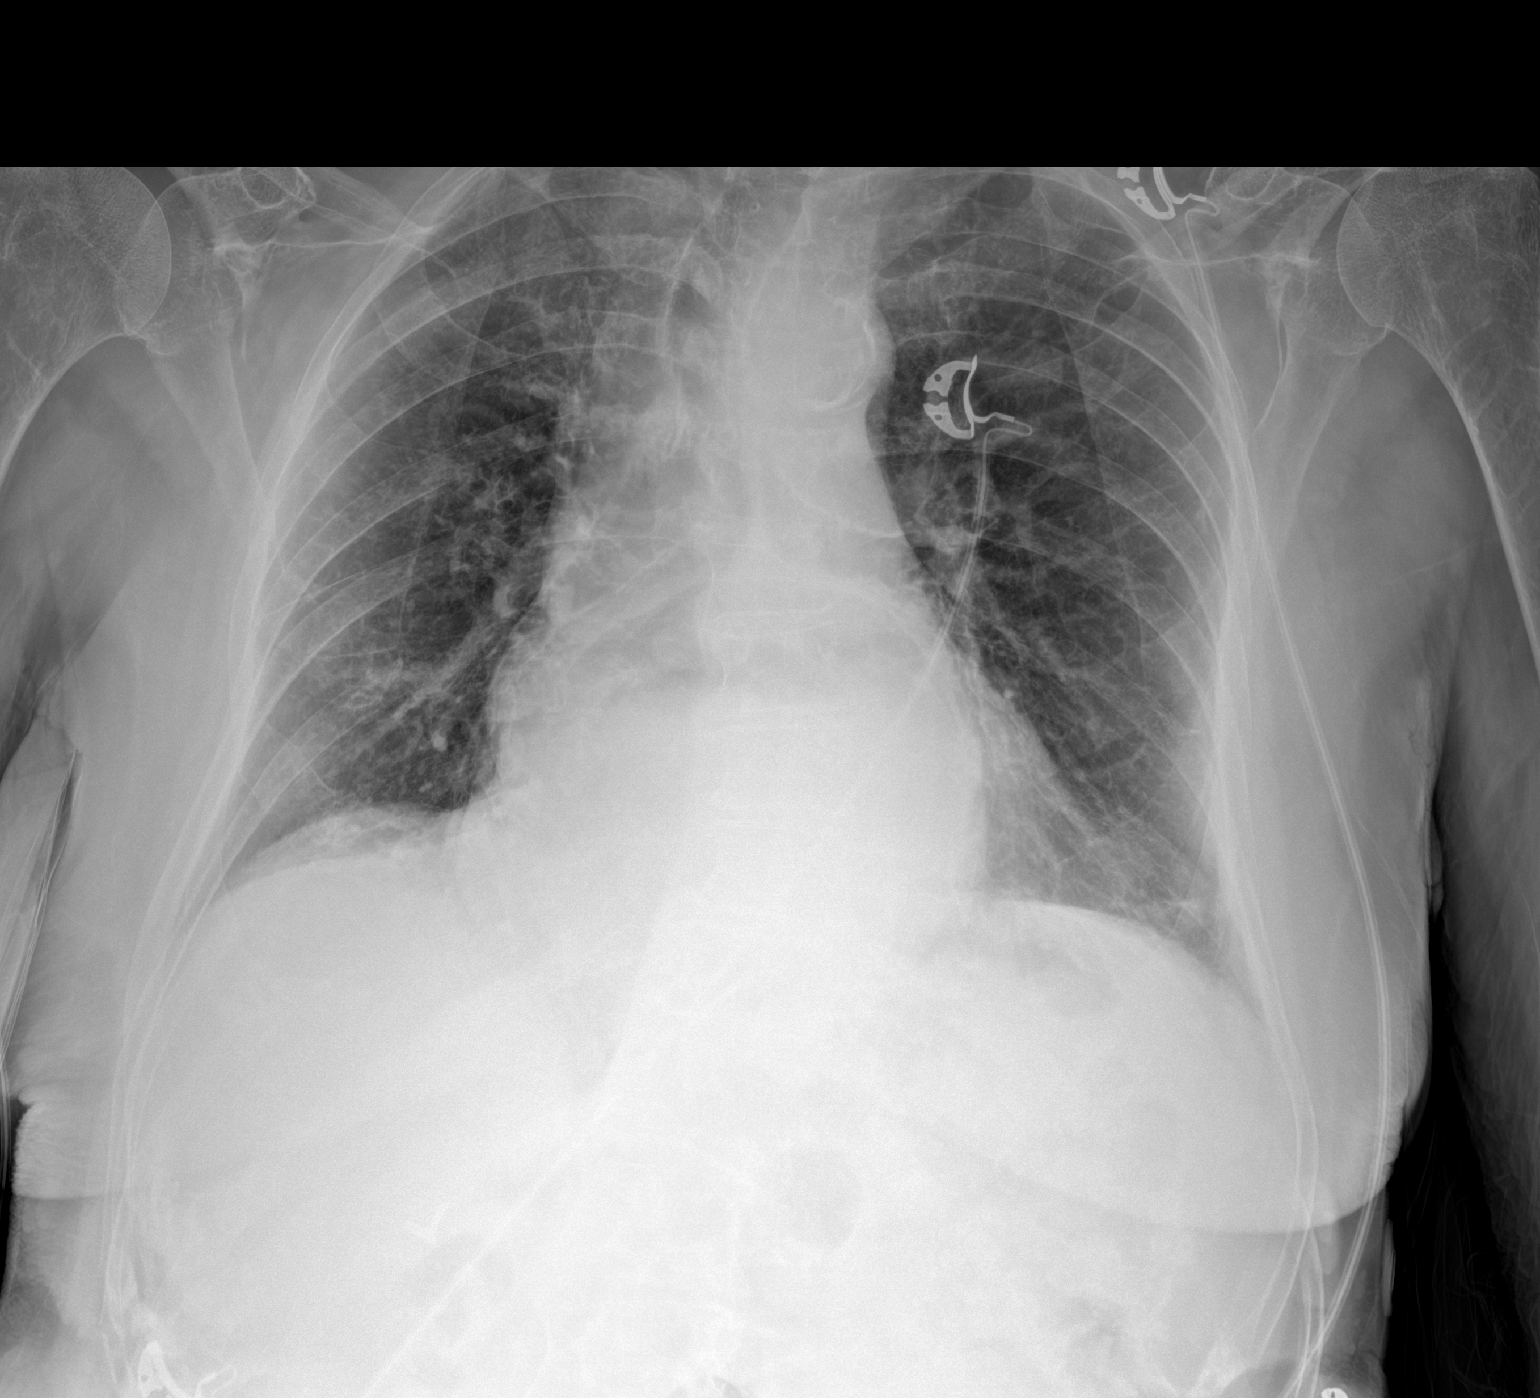

[1 of 1 positions shown; findings below may reference images not displayed]

FINDINGS: Single frontal view of the chest demonstrates an unremarkable
cardiac silhouette. There is a large hiatal hernia. No acute
airspace disease, effusion, or pneumothorax. No acute bony
abnormalities.
IMPRESSION: 1. Large hiatal hernia.
2. No acute airspace disease.

## 2020-05-07 MED ORDER — ONDANSETRON HCL 4 MG/2ML IJ SOLN
4.0000 mg | INTRAMUSCULAR | Status: AC
  Administered 2020-05-07: 4 mg via INTRAVENOUS
  Filled 2020-05-07: qty 2

## 2020-05-07 MED ORDER — FAMOTIDINE IN NACL 20-0.9 MG/50ML-% IV SOLN
20.0000 mg | Freq: Once | INTRAVENOUS | Status: AC
  Administered 2020-05-07: 20 mg via INTRAVENOUS
  Filled 2020-05-07: qty 50

## 2020-05-07 MED ORDER — IOHEXOL 300 MG/ML  SOLN
75.0000 mL | Freq: Once | INTRAMUSCULAR | Status: AC | PRN
  Administered 2020-05-07: 75 mL via INTRAVENOUS

## 2020-05-07 NOTE — ED Provider Notes (Signed)
[facility] Emergency Department Provider Note   ____________________________________________   Event Date/Time   First MD Initiated Contact with Patient 05/07/20 1458     (approximate)  I have reviewed the triage vital signs and the nursing notes.   HISTORY  Chief Complaint Chest Pain    HPI Roberta Leon is a 87 y.o. female history of type 2 diabetes, hypertension, and a large hiatal hernia with associated GERD who presents for substernal chest burning pain that does not radiate and is associated with vomiting and nausea.  Patient states that after eating lunch today of fried fish she began having substernal burning chest pain that is akin to her normal GERD but also started feeling nauseous and had been spitting up her saliva.  Patient has not tried to eat or drink anything since lunch.  Patient denies any worsening of this pain and states that it has partially resolved.  Patient currently denies any vision changes, tinnitus, difficulty speaking, facial droop, sore throat, shortness of breath, abdominal pain, diarrhea, dysuria, or weakness/numbness/paresthesias in any extremity         Past Medical History:  Diagnosis Date  . Diabetes mellitus without complication (HCC)   . GERD (gastroesophageal reflux disease)   . High cholesterol   . Hypertension   . PONV (postoperative nausea and vomiting)     Patient Active Problem List   Diagnosis Date Noted  . Upper GI bleed 06/09/2015    Past Surgical History:  Procedure Laterality Date  . ABDOMINAL HYSTERECTOMY    . CATARACT EXTRACTION W/PHACO Left 04/09/2020   Procedure: CATARACT EXTRACTION PHACO AND INTRAOCULAR LENS PLACEMENT (IOC) LEFT 4.97 00:35.8;  Surgeon: Galen Manila, MD;  Location: [facility];  Service: Ophthalmology;  Laterality: Left;  Diabetic  . CATARACT EXTRACTION W/PHACO Right 04/23/2020   Procedure: CATARACT EXTRACTION PHACO AND INTRAOCULAR LENS PLACEMENT (IOC) RIGHT  5.27 00:37.5;  Surgeon: Galen Manila, MD;  Location: [facility];  Service: Ophthalmology;  Laterality: Right;  Diabetic - diet controlled  . CORONARY STENT PLACEMENT    . ESOPHAGOGASTRODUODENOSCOPY N/A 06/10/2015   Procedure: ESOPHAGOGASTRODUODENOSCOPY (EGD);  Surgeon: Scot Jun, MD;  Location: [facility] END[facility]COPY;  Service: Endoscopy;  Laterality: N/A;  . THYROIDECTOMY, PARTIAL    . TONSILLECTOMY      Prior to Admission medications   Medication Sig Start Date End Date Taking? Authorizing Provider  acetaminophen (TYLENOL) 650 MG CR tablet Take 650 mg by mouth every 8 (eight) hours as needed for pain.    [provider]  amLODipine (NORVASC) 2.5 MG tablet Take 2.5 mg by mouth daily.    [provider]  cholecalciferol (VITAMIN D) 1000 units tablet Take 1,000 Units by mouth daily.    [provider]  Cinnamon 500 MG TABS Take 1,000 mg by mouth daily.    [provider]  enalapril (VASOTEC) 10 MG tablet Take 10 mg by mouth daily.    [provider]  esomeprazole (NEXIUM) 40 MG capsule Take 1 capsule (40 mg total) by mouth 2 (two) times daily before a meal. 06/11/15   Auburn Bilberry, MD  gabapentin (NEURONTIN) 100 MG capsule Take 100 mg by mouth 3 (three) times daily.    [provider]  hydrochlorothiazide (MICROZIDE) 12.5 MG capsule Take 12.5 mg by mouth daily.    [provider]  metFORMIN (GLUCOPHAGE) 500 MG tablet Take 500 mg by mouth 2 (two) times daily with a meal. Patient not taking: Reported on 04/23/2020  [provider]  metoprolol succinate (TOPROL-XL) 50 MG 24 hr tablet Take 50 mg by mouth daily. Take with or immediately following a meal.    [provider]  simvastatin (ZOCOR) 80 MG tablet Take 40 mg by mouth daily.    [provider]  vitamin B-12 (CYANOCOBALAMIN) 1000 MCG tablet Take 1,000 mcg by mouth daily.    [provider]    Allergies Alendronate, Niacin and  related, Shrimp (diagnostic), Tetanus toxoids, Penicillins, and Tape  Family History  Problem Relation Age of Onset  . Hypertension Other   . Breast cancer Sister     Social History Social History   Tobacco Use  . Smoking status: Never Smoker  . Smokeless tobacco: Never Used  Vaping Use  . Vaping Use: Never used  Substance Use Topics  . Alcohol use: No  . Drug use: No    Review of Systems Constitutional: No fever/chills Eyes: No visual changes. ENT: No sore throat. Cardiovascular: Endorses chest pain. Respiratory: Denies shortness of breath. Gastrointestinal: No abdominal pain.  Endorses nausea and vomiting.  No diarrhea. Genitourinary: Negative for dysuria. Musculoskeletal: Negative for acute arthralgias Skin: Negative for rash. Neurological: Negative for headaches, weakness/numbness/paresthesias in any extremity Psychiatric: Negative for suicidal ideation/homicidal ideation   ____________________________________________   PHYSICAL EXAM:  VITAL SIGNS: ED Triage Vitals  Enc Vitals Group     BP 05/07/20 1422 (!) 220/108     Pulse Rate 05/07/20 1422 80     Resp 05/07/20 1422 20     Temp 05/07/20 1422 (!) 97.5 F (36.4 C)     Temp Source 05/07/20 1422 Oral     SpO2 05/07/20 1422 100 %     Weight 05/07/20 1421 130 lb (59 kg)     Height 05/07/20 1421 5\' 4"  (1.626 m)     Head Circumference --      Peak Flow --      Pain Score 05/07/20 1421 10     Pain Loc --      Pain Edu? --      Excl. in GC? --    Constitutional: Alert and oriented. Well appearing and in no acute distress. Eyes: Conjunctivae are normal. PERRL. Head: Atraumatic. Nose: No congestion/rhinnorhea. Mouth/Throat: Mucous membranes are moist. Neck: No stridor Cardiovascular: Grossly normal heart sounds.  Good peripheral circulation. Respiratory: Normal respiratory effort.  No retractions. Gastrointestinal: Soft and mild tenderness to palpation in the midepigastric region. No  distention. Musculoskeletal: No obvious deformities Neurologic:  Normal speech and language. No gross focal neurologic deficits are appreciated. Skin:  Skin is warm and dry. No rash noted. Psychiatric: Mood and affect are normal. Speech and behavior are normal.  ____________________________________________   LABS (all labs ordered are listed, but only abnormal results are displayed)  Labs Reviewed  BASIC METABOLIC PANEL - Abnormal; Notable for the following components:      Result Value   Glucose, Bld 142 (*)    Calcium 10.7 (*)    All other components within normal limits  CBC  CBG MONITORING, ED  TROPONIN I (HIGH SENSITIVITY)  TROPONIN I (HIGH SENSITIVITY)   ____________________________________________  EKG  ED ECG REPORT I, 07/07/20, the attending physician, personally viewed and interpreted this ECG.  Date: 05/07/2020 EKG Time: 1420 Rate: 82 Rhythm: normal sinus rhythm QRS Axis: normal Intervals: normal ST/T Wave abnormalities: normal Narrative Interpretation: no evidence of acute ischemia  ____________________________________________  RADIOLOGY  ED MD interpretation: CT of the chest with IV contrast shows a  large hiatal hernia with fluid distending both intrathoracic and intra-abdominal portions of the stomach.  No evidence of gastric outlet obstruction.  There is mild wall thickening of the distal esophagus consistent with esophagitis.  Multiple other incidental findings detailed below  Official radiology report(s): CT Chest W Contrast  Result Date: 05/07/2020 CLINICAL DATA:  Large hiatal hernia with persistent nausea and vomiting after eating. Chest pain. EXAM: CT CHEST WITH CONTRAST TECHNIQUE: Multidetector CT imaging of the chest was performed during intravenous contrast administration. CONTRAST:  75mL OMNIPAQUE IOHEXOL 300 MG/ML  SOLN COMPARISON:  Radiograph earlier today. FINDINGS: Cardiovascular: Mild aortic atherosclerosis and tortuosity. Moderate  coronary artery calcifications. Heart is normal in size. No pericardial effusion. Mediastinum/Nodes: There is a large hiatal hernia. Approximately 50% of the stomach intrathoracic. There is fluid distending both the intrathoracic as well as intra-abdominal portions of the stomach. No gastric wall thickening or pneumatosis. There is mild wall thickening of the distal esophagus with intraluminal fluid. No enlarged mediastinal or hilar lymph nodes. No axillary adenopathy. The right lobe of the thyroid gland is diffusely heterogeneous and enlarged, with scattered calcifications nodules. Lungs/Pleura: Compressive atelectasis in both lower lobes related to hiatal hernia. Subsegmental atelectasis or scarring in the lingula. Scattered small pulmonary nodules in both lungs, majority of these are a 2-3 mm. This includes left apical nodule, series 3, image 22, left upper lobe series 3, image 46, superior segment left lower lobe series 3, image 53. There is a 4 mm pleural based nodule in the right lower lobe, series 3, image 107. Small clustered tree-in-bud nodularity in the dependent left lower lobe, series 3, image 111. Trachea and central bronchi are patent. Upper Abdomen: No small bowel dilatation in the included upper abdomen. Cholecystectomy with dilatation of the common bile duct, likely sequela of cholecystectomy. Calcified hepatic granuloma. Musculoskeletal: Remote lower sternal body fracture. Remote right posterior lower rib fractures. No acute osseous abnormalities are seen. No evidence of focal bone lesion. IMPRESSION: 1. Large hiatal hernia with fluid distending both the intrathoracic as well as intra-abdominal portions of the stomach. No evidence of gastric outlet obstruction or gastric inflammation. 2. Mild wall thickening of the distal esophagus with intraluminal fluid, favors reflux. 3. Scattered small pulmonary nodules in both lungs, majority of these are 2-3 mm. There is a 4 mm subpleural right lower lobe  nodule. No follow-up needed if patient is low-risk (and has no known or suspected primary neoplasm). Non-contrast chest CT can be considered in 12 months if patient is high-risk. This recommendation follows the consensus statement: Guidelines for Management of Incidental Pulmonary Nodules Detected on CT Images: From the Fleischner Society 2017; Radiology 2017; 284:228-243. 4. Small clustered tree-in-bud nodularity in the dependent left lower lobe may be infectious or inflammatory. 5. Enlarged heterogeneous right lobe of the thyroid gland with scattered calcifications. This is most consistent with goiter. In the setting of significant comorbidities or limited life expectancy, no follow-up recommended. (ref: J Am Coll Radiol. 2015 Feb;12(2): 143-50). 6. Aortic atherosclerosis and advanced coronary artery calcifications. Aortic Atherosclerosis (ICD10-I70.0). Electronically Signed   By: Narda RutherfordMelanie  Sanford M.D.   On: 05/07/2020 16:09   DG Chest Portable 1 View  Result Date: 05/07/2020 CLINICAL DATA:  Chest pain and vomiting for 2 hours EXAM: PORTABLE CHEST 1 VIEW COMPARISON:  06/09/2015 FINDINGS: Single frontal view of the chest demonstrates an unremarkable cardiac silhouette. There is a large hiatal hernia. No acute airspace disease, effusion, or pneumothorax. No acute bony abnormalities. IMPRESSION: 1. Large hiatal hernia. 2. No  acute airspace disease. Electronically Signed   By: Sharlet Salina M.D.   On: 05/07/2020 15:20    ____________________________________________   PROCEDURES  Procedure(s) performed (including Critical Care):  .1-3 Lead EKG Interpretation Performed by: Merwyn Katos, MD Authorized by: Merwyn Katos, MD     Interpretation: normal     ECG rate:  84   ECG rate assessment: normal     Rhythm: sinus rhythm     Ectopy: none     Conduction: normal       ____________________________________________   INITIAL IMPRESSION / ASSESSMENT AND PLAN / ED COURSE  As part of my  medical decision making, I reviewed the following data within the electronic MEDICAL RECORD NUMBER Nursing notes reviewed and incorporated, Labs reviewed, EKG interpreted, Old chart reviewed, Radiograph reviewed and Notes from prior ED visits reviewed and incorporated       Patient presents for acute nausea/vomiting The cause of the patients symptoms is not clear, but the patient is overall well appearing and is suspected to have a transient course of illness.  Given History and Exam there does not appear to be an emergent cause of the symptoms such as small bowel obstruction, coronary syndrome, bowel ischemia, DKA, pancreatitis, appendicitis, other acute abdomen or other emergent problem.  Given CT findings of enlarged hiatal hernia from previous exams, this is likely the cause of her acute symptoms here today including esophagitis.  Patient was encouraged to follow-up with her gastroenterologist.  Reassessment: After treatment, the patient is feeling much better, tolerating PO fluids, and shows no signs of dehydration.   Disposition: Discharge home with prompt primary care physician follow up in the next 48 hours. Strict return precautions discussed.      ____________________________________________   FINAL CLINICAL IMPRESSION(S) / ED DIAGNOSES  Final diagnoses:  Chest pain, unspecified type  Esophagitis  Hiatal hernia with GERD and esophagitis     ED Discharge Orders    None       Note:  This document was prepared using Dragon voice recognition software and may include unintentional dictation errors.   Merwyn Katos, MD 05/07/20 [id number]

## 2020-05-07 NOTE — ED Triage Notes (Addendum)
Pt via POV from home. Pt felt fine this morning took all of her morning pills but the past hour pt has been having centralized CP and vomiting mucus. Pt has a hx of a hiatal hernia. Denies nausea at this time. Pt is slumped in the chair and looks uncomfortable.

## 2021-03-10 ENCOUNTER — Other Ambulatory Visit: Payer: Self-pay | Admitting: Internal Medicine

## 2021-03-10 DIAGNOSIS — Z1231 Encounter for screening mammogram for malignant neoplasm of breast: Secondary | ICD-10-CM

## 2021-04-10 ENCOUNTER — Ambulatory Visit
Admission: RE | Admit: 2021-04-10 | Discharge: 2021-04-10 | Disposition: A | Discharge status: Home / Self Care | Payer: Medicare HMO | Source: Ambulatory Visit | Attending: Internal Medicine | Admitting: Internal Medicine

## 2021-04-10 ENCOUNTER — Other Ambulatory Visit: Payer: Self-pay

## 2021-04-10 DIAGNOSIS — Z1231 Encounter for screening mammogram for malignant neoplasm of breast: Secondary | ICD-10-CM | POA: Insufficient documentation

## 2021-04-10 IMAGING — MG MM DIGITAL SCREENING BILAT W/ TOMO AND CAD
8 series · 8 of 24 positions shown · non-contrast
Comparison: Previous exam(s).

CLINICAL DATA: Screening.

EXAM:
DIGITAL SCREENING BILATERAL MAMMOGRAM WITH TOMOSYNTHESIS AND CAD
TECHNIQUE: Bilateral screening digital craniocaudal and mediolateral oblique
mammograms were obtained. Bilateral screening digital breast
tomosynthesis was performed. The images were evaluated with
computer-aided detection.

[R MLO synth-2D]
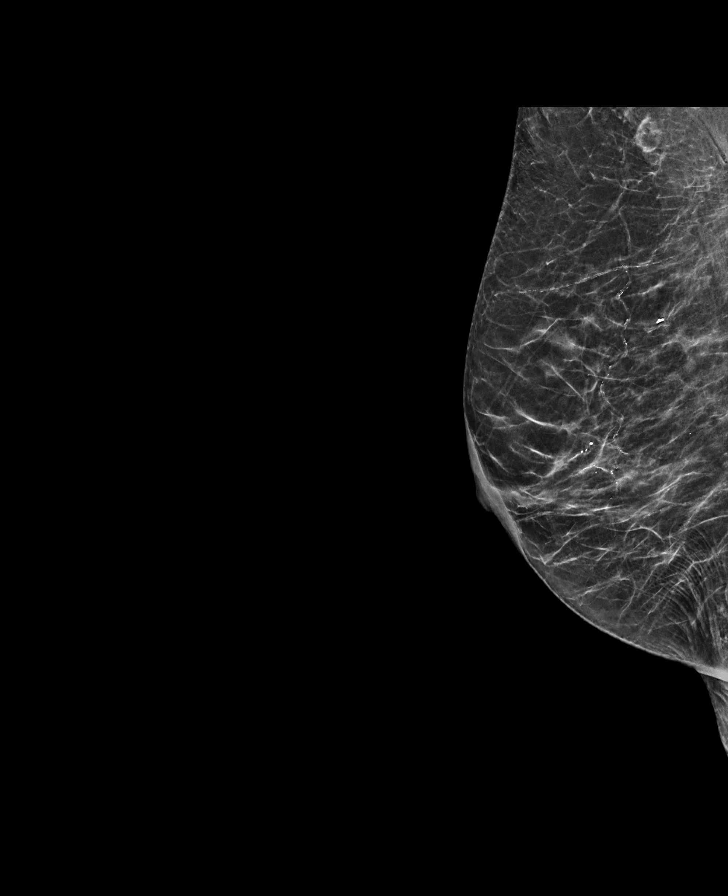

[L CC synth-2D]
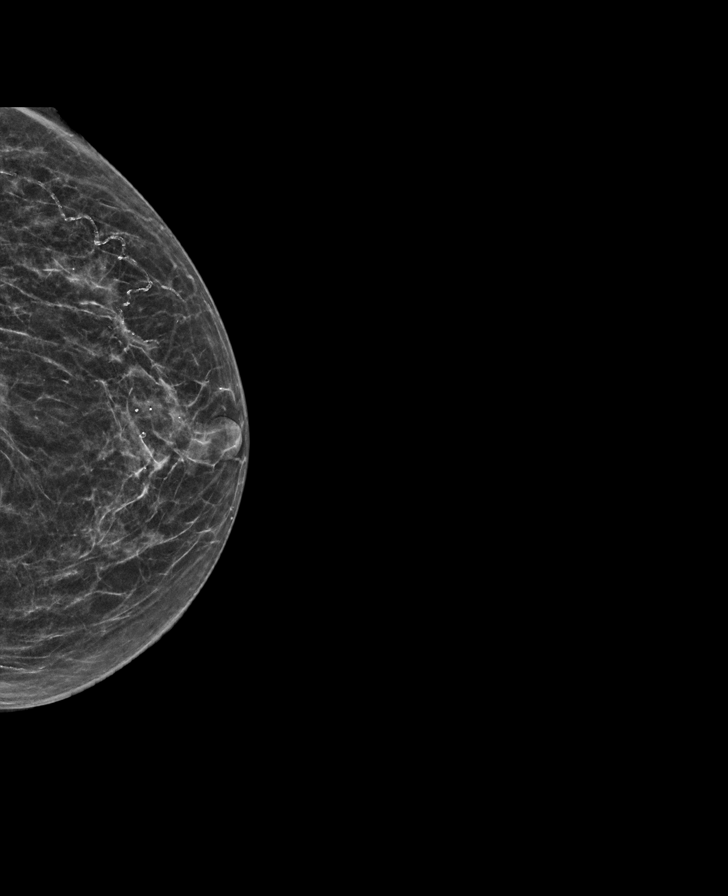

[R CC synth-2D]
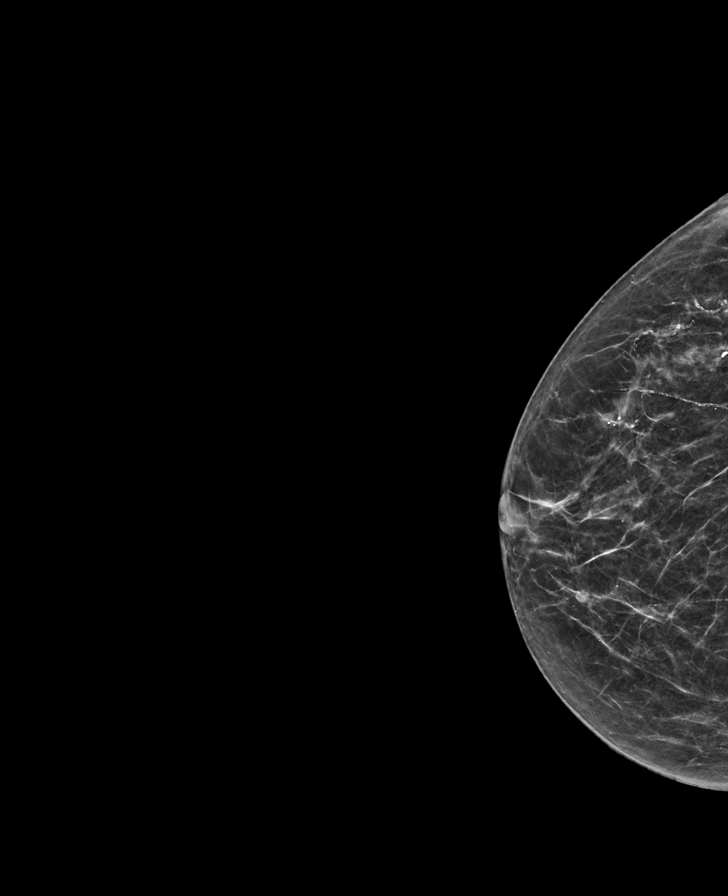

[L MLO synth-2D]
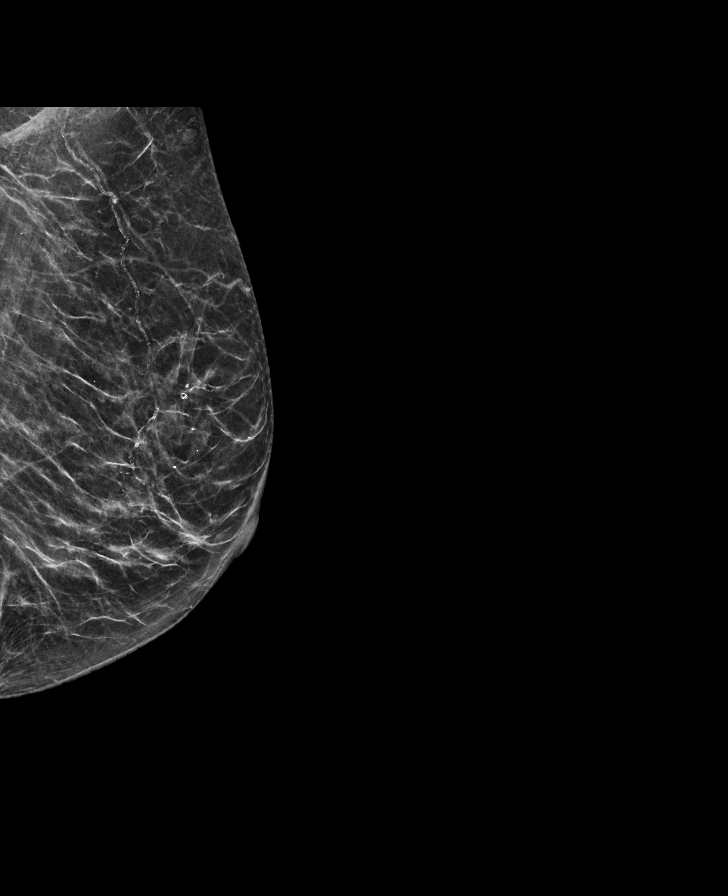

[R MLO tomo · tomo slice 27/54.0]
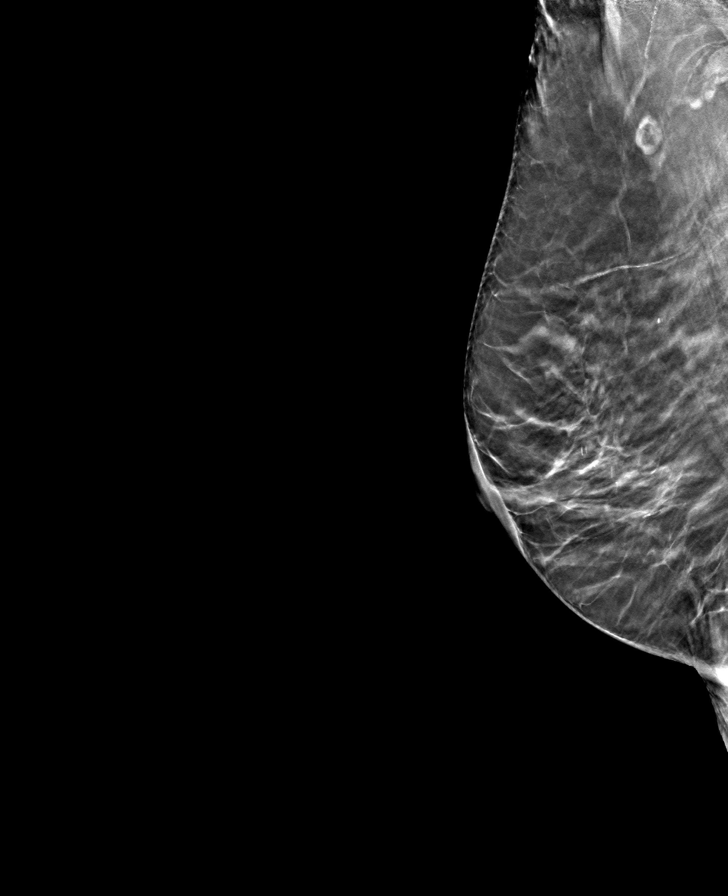

[L MLO tomo · tomo slice 27/54.0]
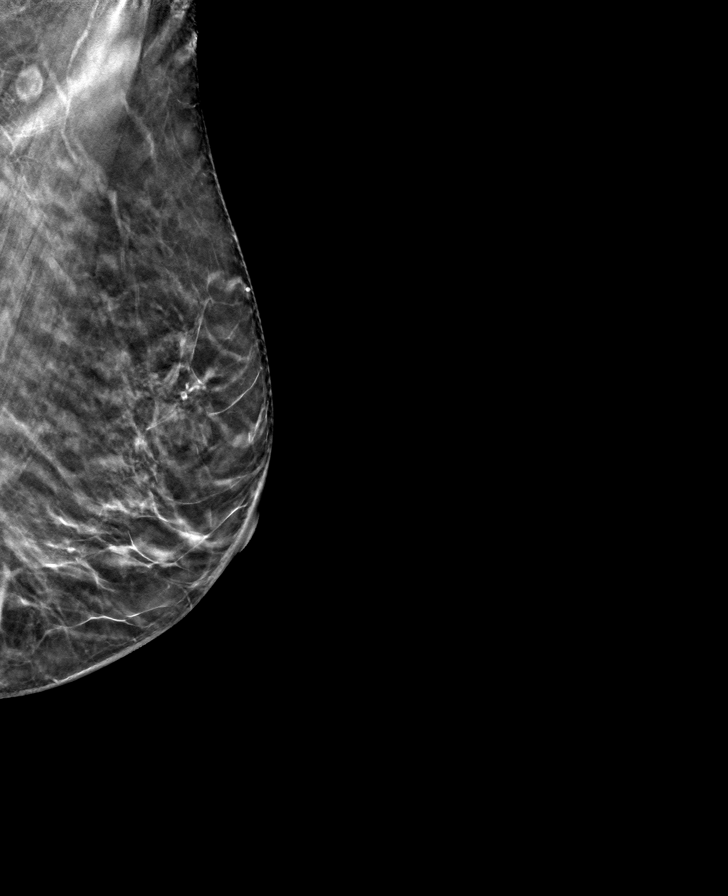

[L CC tomo · tomo slice 26/51.0]
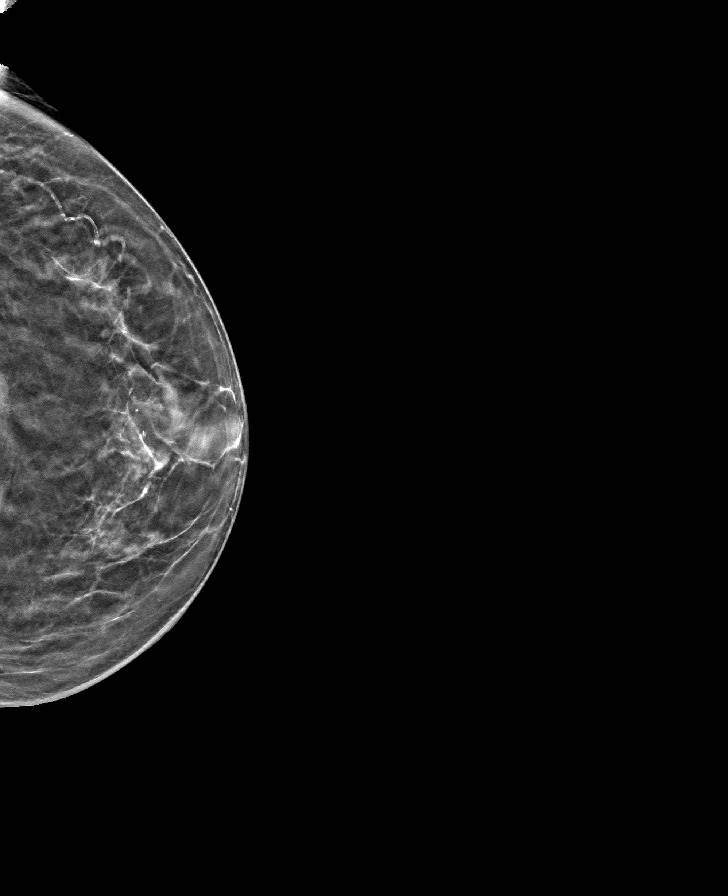

[R CC tomo · tomo slice 26/51.0]
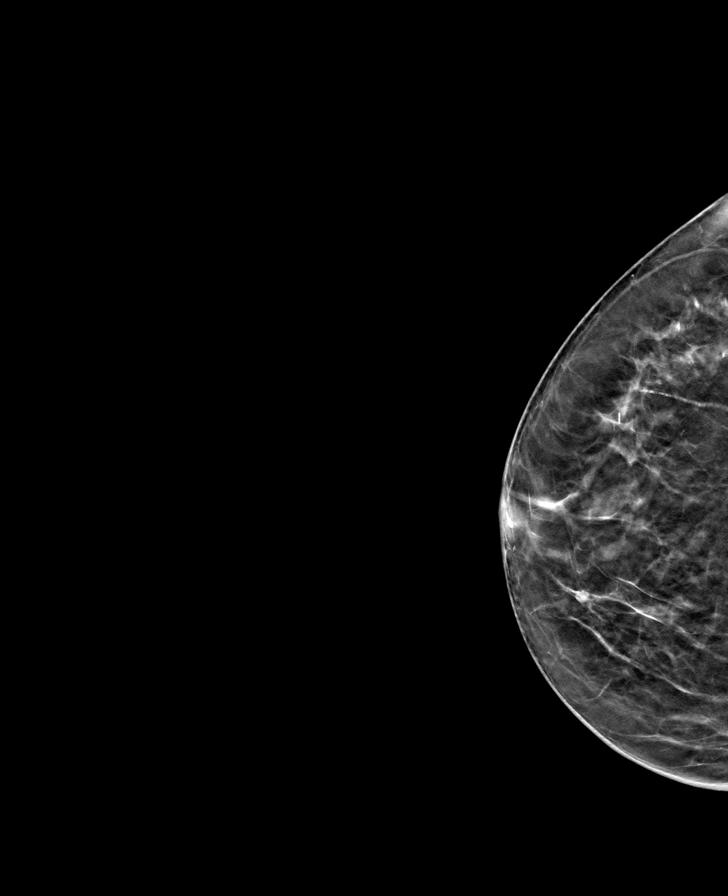

[8 of 24 positions shown; findings below may reference images not displayed]

ACR Breast Density Category b: There are scattered areas of
fibroglandular density.
FINDINGS: In the right breast, a possible mass warrants further evaluation. In
the left breast, no findings suspicious for malignancy.
IMPRESSION: Further evaluation is suggested for a possible mass in the right
breast.

RECOMMENDATION:
Diagnostic mammogram and possibly ultrasound of the right breast.
(Code:[id number])

The patient will be contacted regarding the findings, and additional
imaging will be scheduled.

BI-RADS CATEGORY  0: Incomplete. Need additional imaging evaluation
and/or prior mammograms for comparison.

## 2021-04-16 ENCOUNTER — Other Ambulatory Visit: Payer: Self-pay | Admitting: Internal Medicine

## 2021-04-16 DIAGNOSIS — R928 Other abnormal and inconclusive findings on diagnostic imaging of breast: Secondary | ICD-10-CM

## 2021-04-16 DIAGNOSIS — N63 Unspecified lump in unspecified breast: Secondary | ICD-10-CM

## 2021-04-30 ENCOUNTER — Other Ambulatory Visit: Payer: Self-pay

## 2021-04-30 ENCOUNTER — Ambulatory Visit
Admission: RE | Admit: 2021-04-30 | Discharge: 2021-04-30 | Disposition: A | Discharge status: Home / Self Care | Payer: Medicare HMO | Source: Ambulatory Visit | Attending: Internal Medicine | Admitting: Internal Medicine

## 2021-04-30 DIAGNOSIS — N63 Unspecified lump in unspecified breast: Secondary | ICD-10-CM | POA: Insufficient documentation

## 2021-04-30 DIAGNOSIS — R928 Other abnormal and inconclusive findings on diagnostic imaging of breast: Secondary | ICD-10-CM | POA: Insufficient documentation

## 2021-04-30 IMAGING — MG MM DIGITAL DIAGNOSTIC UNILAT*R* W/ TOMO W/ CAD
4 series · 4 of 12 positions shown · non-contrast
Comparison: Prior films

CLINICAL DATA: Callback from screening mammogram for possible mass
right breast

EXAM:
DIGITAL DIAGNOSTIC UNILATERAL RIGHT MAMMOGRAM WITH TOMOSYNTHESIS AND
CAD; ULTRASOUND RIGHT BREAST LIMITED
TECHNIQUE: Right digital diagnostic mammography and breast tomosynthesis was
performed. The images were evaluated with computer-aided detection.;
Targeted ultrasound examination of the right breast was performed

[R CC synth-2D]
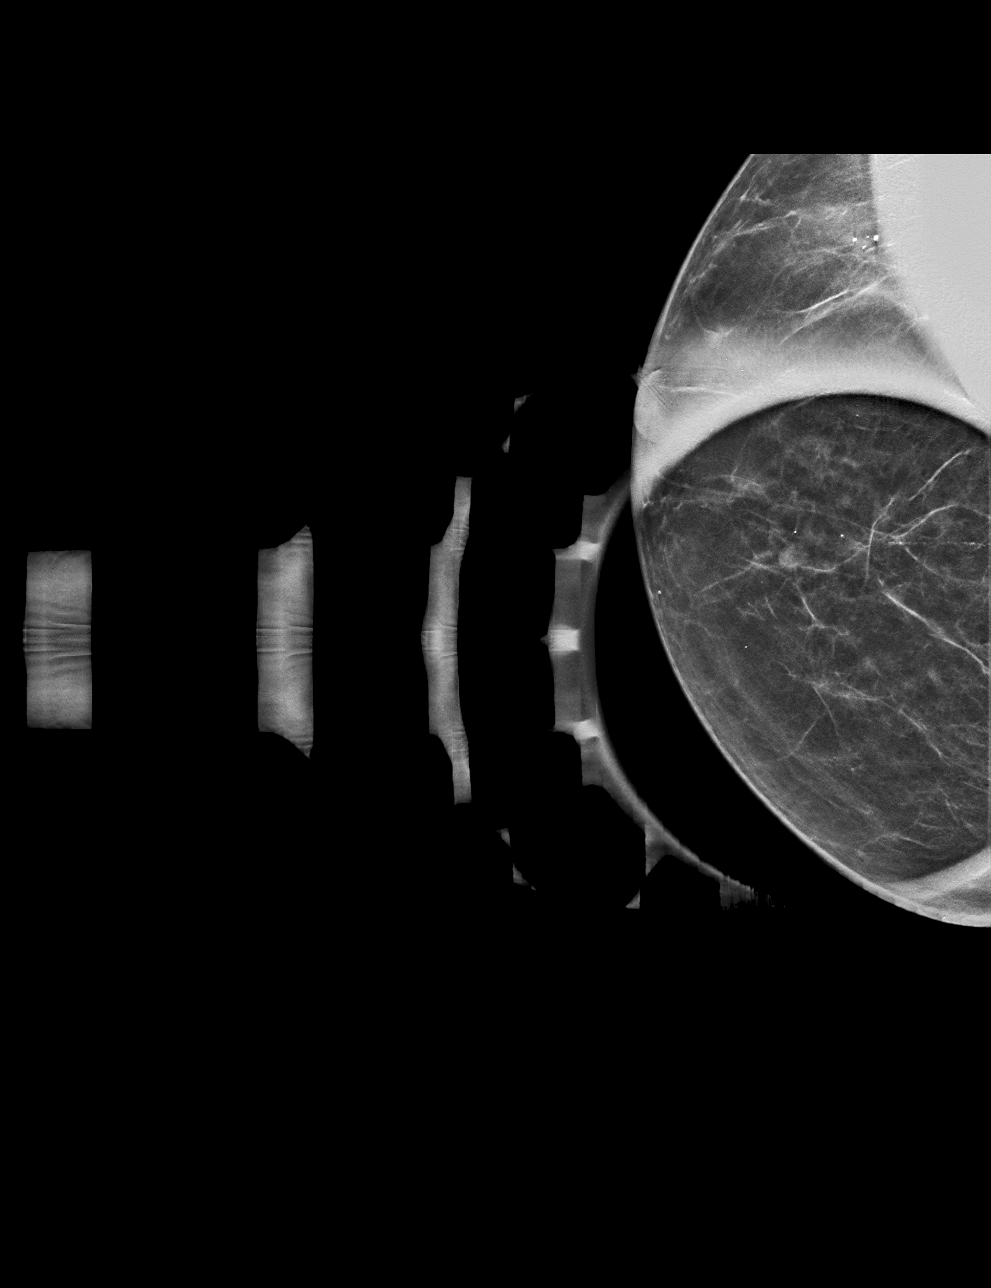

[R ML synth-2D]
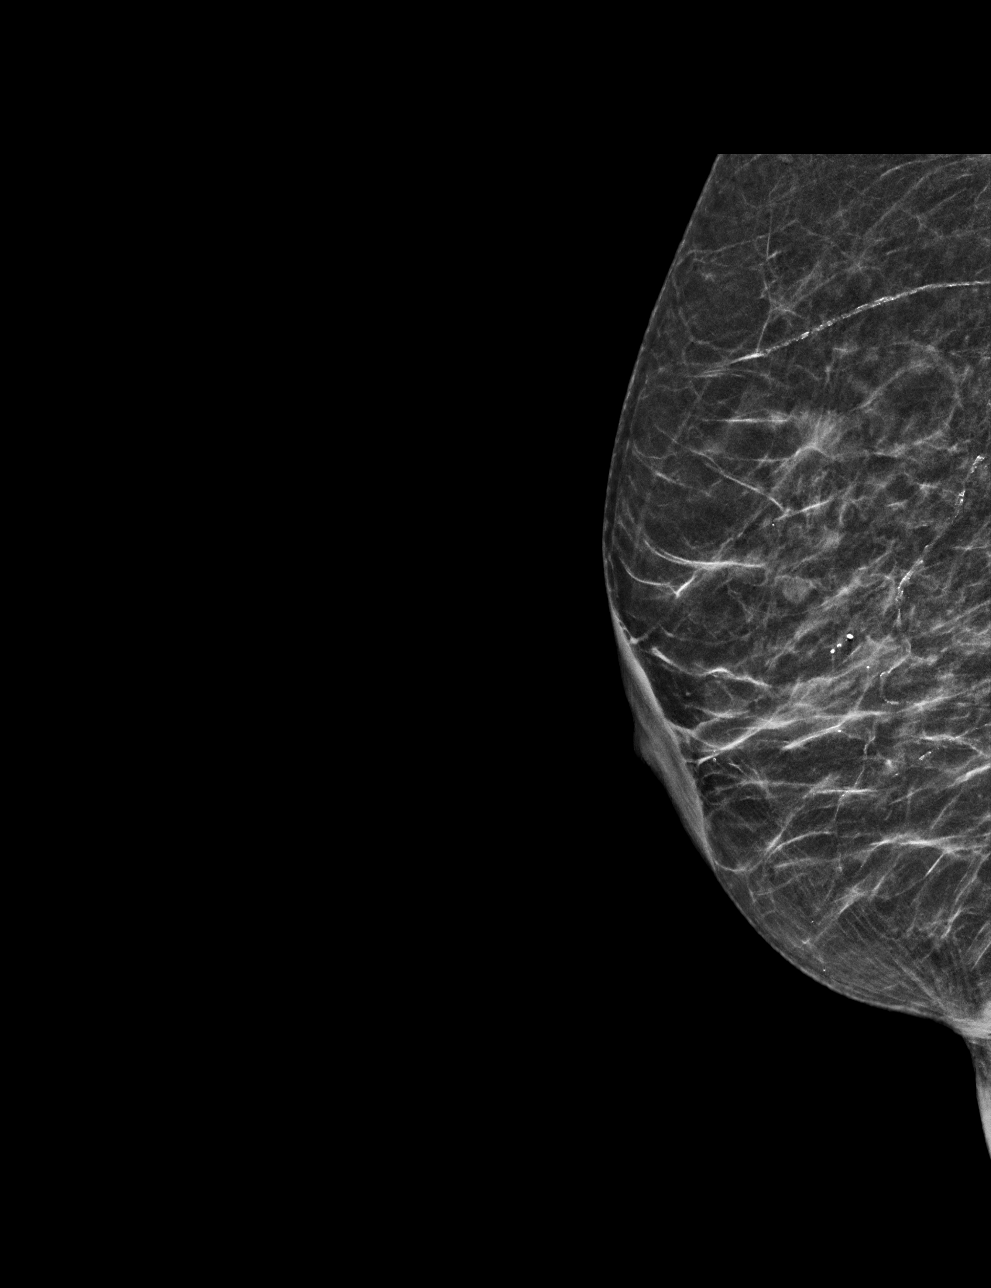

[R ML tomo · tomo slice 25/48.0]
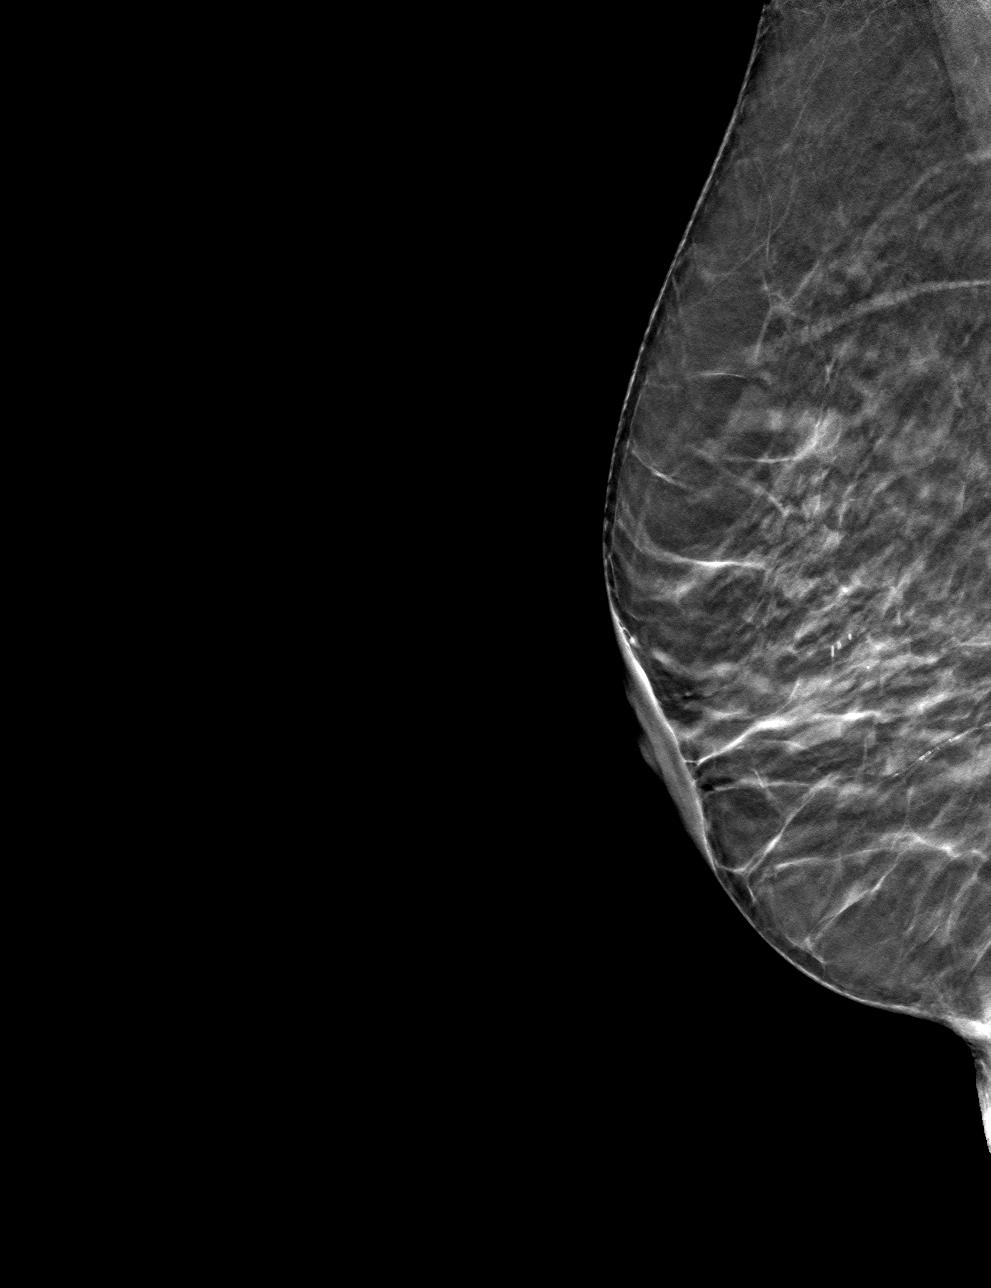

[R CC tomo · tomo slice 23/44.0]
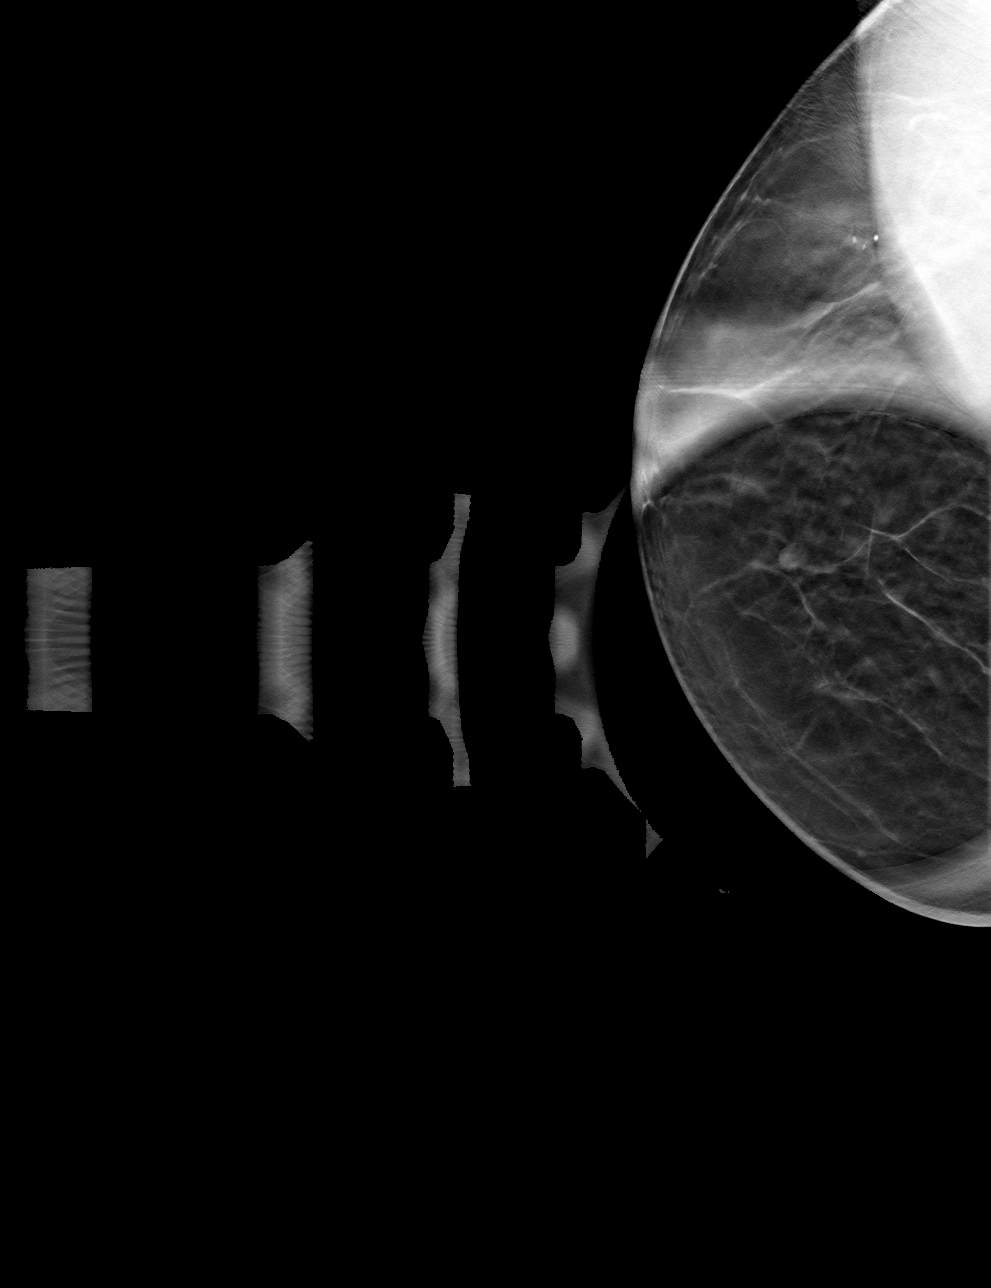

[4 of 12 positions shown; findings below may reference images not displayed]

ACR Breast Density Category b: There are scattered areas of
fibroglandular density.
FINDINGS: Spot compression right cc view, lateral view of right breast are
submitted. Previously questioned mass in the medial upper right
breast is persistent and appears unchanged compared to prior
mammogram October 2018.

Targeted ultrasound is performed, showing possible complicated cyst
at the right breast 1 o'clock 3 cm from nipple measuring 4 x 3 x 4
mm correlating to the mammographic finding. Ultrasound of the right
axilla is negative.
IMPRESSION: Probable benign findings.

RECOMMENDATION:
Six-month follow-up ultrasound right breast.

I have discussed the findings and recommendations with the patient.
If applicable, a reminder letter will be sent to the patient
regarding the next appointment.

BI-RADS CATEGORY  3: Probably benign.

## 2022-01-30 DEATH — deceased
